# Patient Record
Sex: Male | Born: 1965 | Race: Black or African American | Hispanic: No | Marital: Married | Smoking: Former smoker
Health system: Southern US, Community
[De-identification: ages and names within clinical notes are randomized; demographics above are authoritative.]

## PROBLEM LIST (undated history)

## (undated) DIAGNOSIS — K219 Gastro-esophageal reflux disease without esophagitis: Secondary | ICD-10-CM

## (undated) DIAGNOSIS — M359 Systemic involvement of connective tissue, unspecified: Secondary | ICD-10-CM

## (undated) DIAGNOSIS — D649 Anemia, unspecified: Secondary | ICD-10-CM

## (undated) DIAGNOSIS — N189 Chronic kidney disease, unspecified: Secondary | ICD-10-CM

## (undated) DIAGNOSIS — K209 Esophagitis, unspecified without bleeding: Secondary | ICD-10-CM

## (undated) DIAGNOSIS — I272 Pulmonary hypertension, unspecified: Secondary | ICD-10-CM

## (undated) DIAGNOSIS — E119 Type 2 diabetes mellitus without complications: Secondary | ICD-10-CM

## (undated) DIAGNOSIS — I7300 Raynaud's syndrome without gangrene: Secondary | ICD-10-CM

## (undated) DIAGNOSIS — J8489 Other specified interstitial pulmonary diseases: Secondary | ICD-10-CM

## (undated) DIAGNOSIS — I10 Essential (primary) hypertension: Secondary | ICD-10-CM

## (undated) DIAGNOSIS — I499 Cardiac arrhythmia, unspecified: Secondary | ICD-10-CM

## (undated) DIAGNOSIS — K222 Esophageal obstruction: Secondary | ICD-10-CM

## (undated) DIAGNOSIS — M349 Systemic sclerosis, unspecified: Secondary | ICD-10-CM

## (undated) DIAGNOSIS — L943 Sclerodactyly: Secondary | ICD-10-CM

## (undated) DIAGNOSIS — J8410 Pulmonary fibrosis, unspecified: Secondary | ICD-10-CM

## (undated) DIAGNOSIS — E785 Hyperlipidemia, unspecified: Secondary | ICD-10-CM

## (undated) DIAGNOSIS — R1310 Dysphagia, unspecified: Secondary | ICD-10-CM

## (undated) HISTORY — PX: UPPER GI ENDOSCOPY: SHX6162

## (undated) HISTORY — PX: RENAL BIOPSY: SHX156

## (undated) HISTORY — PX: LUNG BIOPSY: SHX232

---

## 2013-04-29 ENCOUNTER — Ambulatory Visit: Payer: Self-pay | Admitting: Rheumatology

## 2013-07-16 ENCOUNTER — Other Ambulatory Visit: Payer: Self-pay | Admitting: Family Medicine

## 2013-07-16 LAB — CBC WITH DIFFERENTIAL/PLATELET
BASOS PCT: 0.5 %
Basophil #: 0 10*3/uL (ref 0.0–0.1)
EOS ABS: 0.3 10*3/uL (ref 0.0–0.7)
Eosinophil %: 4.1 %
HCT: 39.4 % — ABNORMAL LOW (ref 40.0–52.0)
HGB: 12.6 g/dL — ABNORMAL LOW (ref 13.0–18.0)
Lymphocyte #: 1.5 10*3/uL (ref 1.0–3.6)
Lymphocyte %: 20.4 %
MCH: 26.2 pg (ref 26.0–34.0)
MCHC: 31.9 g/dL — ABNORMAL LOW (ref 32.0–36.0)
MCV: 82 fL (ref 80–100)
MONOS PCT: 5.2 %
Monocyte #: 0.4 x10 3/mm (ref 0.2–1.0)
Neutrophil #: 5.3 10*3/uL (ref 1.4–6.5)
Neutrophil %: 69.8 %
Platelet: 185 10*3/uL (ref 150–440)
RBC: 4.8 10*6/uL (ref 4.40–5.90)
RDW: 15.4 % — ABNORMAL HIGH (ref 11.5–14.5)
WBC: 7.5 10*3/uL (ref 3.8–10.6)

## 2013-07-16 LAB — BASIC METABOLIC PANEL
Anion Gap: 4 — ABNORMAL LOW (ref 7–16)
BUN: 23 mg/dL — ABNORMAL HIGH (ref 7–18)
CALCIUM: 9 mg/dL (ref 8.5–10.1)
Chloride: 105 mmol/L (ref 98–107)
Co2: 28 mmol/L (ref 21–32)
Creatinine: 2.25 mg/dL — ABNORMAL HIGH (ref 0.60–1.30)
GFR CALC AF AMER: 39 — AB
GFR CALC NON AF AMER: 33 — AB
Glucose: 163 mg/dL — ABNORMAL HIGH (ref 65–99)
Osmolality: 281 (ref 275–301)
Potassium: 3.6 mmol/L (ref 3.5–5.1)
Sodium: 137 mmol/L (ref 136–145)

## 2013-07-16 LAB — HEPATIC FUNCTION PANEL A (ARMC)
ALK PHOS: 85 U/L
Albumin: 3.8 g/dL (ref 3.4–5.0)
BILIRUBIN TOTAL: 0.2 mg/dL (ref 0.2–1.0)
Bilirubin, Direct: <0.1 mg/dL (ref 0.00–0.20)
SGOT(AST): 14 U/L — ABNORMAL LOW (ref 15–37)
SGPT (ALT): 23 U/L (ref 12–78)
Total Protein: 8.2 g/dL (ref 6.4–8.2)

## 2013-07-16 LAB — HEMOGLOBIN A1C: Hemoglobin A1C: 7.2 % — ABNORMAL HIGH (ref 4.2–6.3)

## 2013-08-04 ENCOUNTER — Ambulatory Visit: Payer: Self-pay | Admitting: Nephrology

## 2014-02-15 ENCOUNTER — Emergency Department: Payer: Self-pay | Admitting: Emergency Medicine

## 2014-12-22 ENCOUNTER — Other Ambulatory Visit: Payer: Self-pay | Admitting: Specialist

## 2014-12-22 DIAGNOSIS — J8410 Pulmonary fibrosis, unspecified: Secondary | ICD-10-CM

## 2014-12-30 ENCOUNTER — Ambulatory Visit
Admission: RE | Admit: 2014-12-30 | Discharge: 2014-12-30 | Disposition: A | Discharge status: Home / Self Care | Payer: BLUE CROSS/BLUE SHIELD | Source: Ambulatory Visit | Attending: Specialist | Admitting: Specialist

## 2014-12-30 DIAGNOSIS — R590 Localized enlarged lymph nodes: Secondary | ICD-10-CM | POA: Insufficient documentation

## 2014-12-30 DIAGNOSIS — J439 Emphysema, unspecified: Secondary | ICD-10-CM | POA: Diagnosis not present

## 2014-12-30 DIAGNOSIS — R911 Solitary pulmonary nodule: Secondary | ICD-10-CM | POA: Diagnosis not present

## 2014-12-30 DIAGNOSIS — R0602 Shortness of breath: Secondary | ICD-10-CM | POA: Insufficient documentation

## 2014-12-30 DIAGNOSIS — J8410 Pulmonary fibrosis, unspecified: Secondary | ICD-10-CM

## 2015-02-03 ENCOUNTER — Other Ambulatory Visit: Payer: Self-pay | Admitting: Specialist

## 2015-02-03 DIAGNOSIS — R911 Solitary pulmonary nodule: Secondary | ICD-10-CM

## 2015-04-27 ENCOUNTER — Encounter: Payer: Self-pay | Admitting: *Deleted

## 2015-04-28 ENCOUNTER — Ambulatory Visit: Payer: BLUE CROSS/BLUE SHIELD | Admitting: Anesthesiology

## 2015-04-28 ENCOUNTER — Ambulatory Visit
Admission: RE | Admit: 2015-04-28 | Discharge: 2015-04-28 | Disposition: A | Discharge status: Home / Self Care | Payer: BLUE CROSS/BLUE SHIELD | Source: Ambulatory Visit | Attending: Gastroenterology | Admitting: Gastroenterology

## 2015-04-28 ENCOUNTER — Encounter
Admission: RE | Disposition: A | Discharge status: Home / Self Care | Payer: Self-pay | Source: Ambulatory Visit | Attending: Gastroenterology

## 2015-04-28 DIAGNOSIS — I10 Essential (primary) hypertension: Secondary | ICD-10-CM | POA: Diagnosis not present

## 2015-04-28 DIAGNOSIS — R1310 Dysphagia, unspecified: Secondary | ICD-10-CM | POA: Diagnosis not present

## 2015-04-28 DIAGNOSIS — K210 Gastro-esophageal reflux disease with esophagitis: Secondary | ICD-10-CM | POA: Insufficient documentation

## 2015-04-28 DIAGNOSIS — K222 Esophageal obstruction: Secondary | ICD-10-CM | POA: Insufficient documentation

## 2015-04-28 DIAGNOSIS — K449 Diaphragmatic hernia without obstruction or gangrene: Secondary | ICD-10-CM | POA: Diagnosis not present

## 2015-04-28 DIAGNOSIS — Z7982 Long term (current) use of aspirin: Secondary | ICD-10-CM | POA: Insufficient documentation

## 2015-04-28 DIAGNOSIS — E119 Type 2 diabetes mellitus without complications: Secondary | ICD-10-CM | POA: Diagnosis not present

## 2015-04-28 DIAGNOSIS — Z7984 Long term (current) use of oral hypoglycemic drugs: Secondary | ICD-10-CM | POA: Diagnosis not present

## 2015-04-28 DIAGNOSIS — I272 Other secondary pulmonary hypertension: Secondary | ICD-10-CM | POA: Insufficient documentation

## 2015-04-28 DIAGNOSIS — Z79899 Other long term (current) drug therapy: Secondary | ICD-10-CM | POA: Insufficient documentation

## 2015-04-28 DIAGNOSIS — I7300 Raynaud's syndrome without gangrene: Secondary | ICD-10-CM | POA: Diagnosis not present

## 2015-04-28 DIAGNOSIS — J8410 Pulmonary fibrosis, unspecified: Secondary | ICD-10-CM | POA: Insufficient documentation

## 2015-04-28 DIAGNOSIS — F17200 Nicotine dependence, unspecified, uncomplicated: Secondary | ICD-10-CM | POA: Diagnosis not present

## 2015-04-28 HISTORY — DX: Gastro-esophageal reflux disease without esophagitis: K21.9

## 2015-04-28 HISTORY — DX: Systemic sclerosis, unspecified: M34.9

## 2015-04-28 HISTORY — DX: Dysphagia, unspecified: R13.10

## 2015-04-28 HISTORY — PX: ESOPHAGOGASTRODUODENOSCOPY (EGD) WITH PROPOFOL: SHX5813

## 2015-04-28 HISTORY — DX: Sclerodactyly: L94.3

## 2015-04-28 HISTORY — DX: Pulmonary hypertension, unspecified: I27.20

## 2015-04-28 HISTORY — DX: Esophagitis, unspecified without bleeding: K20.90

## 2015-04-28 HISTORY — DX: Esophagitis, unspecified: K20.9

## 2015-04-28 HISTORY — DX: Cardiac arrhythmia, unspecified: I49.9

## 2015-04-28 HISTORY — DX: Type 2 diabetes mellitus without complications: E11.9

## 2015-04-28 HISTORY — DX: Raynaud's syndrome without gangrene: I73.00

## 2015-04-28 HISTORY — DX: Pulmonary fibrosis, unspecified: J84.10

## 2015-04-28 LAB — GLUCOSE, CAPILLARY: Glucose-Capillary: 86 mg/dL (ref 65–99)

## 2015-04-28 SURGERY — ESOPHAGOGASTRODUODENOSCOPY (EGD) WITH PROPOFOL
Anesthesia: General

## 2015-04-28 MED ORDER — PROPOFOL 500 MG/50ML IV EMUL
INTRAVENOUS | Status: DC | PRN
  Administered 2015-04-28: 180 ug/kg/min via INTRAVENOUS

## 2015-04-28 MED ORDER — PROPOFOL 10 MG/ML IV BOLUS
INTRAVENOUS | Status: DC | PRN
  Administered 2015-04-28: 100 mg via INTRAVENOUS
  Administered 2015-04-28: 20 mg via INTRAVENOUS

## 2015-04-28 MED ORDER — SODIUM CHLORIDE 0.9 % IV SOLN
INTRAVENOUS | Status: DC
  Administered 2015-04-28: 1000 mL via INTRAVENOUS
  Administered 2015-04-28: 08:00:00 via INTRAVENOUS

## 2015-04-28 NOTE — H&P (Signed)
Primary Care Physician:  Barbette ReichmannHANDE,VISHWANATH, MD  Pre-Procedure History & Physical: HPI:  Joe Mathews is a 50 y.o. male is here for an endoscopy.   Past Medical History  Diagnosis Date  . Diabetes mellitus without complication (HCC)   . Esophagitis   . Dysrhythmia   . Pulmonary fibrosis (HCC)   . Pulmonary hypertension (HCC)   . Scleroderma (HCC)   . Raynaud's disease   . Sclerodactyly   . Dysphagia   . GERD (gastroesophageal reflux disease)     Past Surgical History  Procedure Laterality Date  . Upper gi endoscopy    . Lung biopsy    . Renal biopsy      Prior to Admission medications   Medication Sig Start Date End Date Taking? Authorizing Provider  aspirin EC 81 MG tablet Take 81 mg by mouth daily.   Yes Historical Provider, MD  cloNIDine (CATAPRES - DOSED IN MG/24 HR) 0.2 mg/24hr patch Place 0.2 mg onto the skin once a week.   Yes Historical Provider, MD  doxazosin (CARDURA) 4 MG tablet Take 4 mg by mouth daily.   Yes Historical Provider, MD  furosemide (LASIX) 40 MG tablet Take 40 mg by mouth.   Yes Historical Provider, MD  glimepiride (AMARYL) 4 MG tablet Take 4 mg by mouth daily with breakfast.   Yes Historical Provider, MD  linagliptin (TRADJENTA) 5 MG TABS tablet Take 5 mg by mouth daily.   Yes Historical Provider, MD  Liraglutide (VICTOZA) 18 MG/3ML SOPN Inject 1.8 mg into the skin daily.   Yes Historical Provider, MD  Macitentan (OPSUMIT) 10 MG TABS Take 10 mg by mouth daily.   Yes Historical Provider, MD  metFORMIN (GLUCOPHAGE) 1000 MG tablet Take 1,000 mg by mouth 2 (two) times daily with a meal.   Yes Historical Provider, MD  mycophenolate (CELLCEPT) 500 MG tablet Take 1,500 mg by mouth 2 (two) times daily.   Yes Historical Provider, MD  NIFEdipine (ADALAT CC) 90 MG 24 hr tablet Take 90 mg by mouth daily.   Yes Historical Provider, MD  ranitidine (ZANTAC) 300 MG tablet Take 300 mg by mouth daily before breakfast.   Yes Historical Provider, MD  tadalafil  (CIALIS) 20 MG tablet Take 40 mg by mouth daily.   Yes Historical Provider, MD  valsartan (DIOVAN) 320 MG tablet Take 320 mg by mouth daily.   Yes Historical Provider, MD    Allergies as of 04/07/2015  . (Not on File)    Family History  Problem Relation Age of Onset  . Cancer Mother   . Diabetes Mellitus II Mother   . Diabetes Mellitus II Father     Social History   Social History  . Marital Status: Married    Spouse Name: N/A  . Number of Children: N/A  . Years of Education: N/A   Occupational History  . Not on file.   Social History Main Topics  . Smoking status: Current Every Day Smoker -- 0.25 packs/day    Types: Cigarettes  . Smokeless tobacco: Former NeurosurgeonUser    Quit date: 07/13/1985  . Alcohol Use: No  . Drug Use: No  . Sexual Activity: Not on file   Other Topics Concern  . Not on file   Social History Narrative     Physical Exam: BP 132/68 mmHg  Pulse 64  Temp(Src) 96.7 F (35.9 C) (Tympanic)  Resp 17  Ht 6\' 2"  (1.88 m)  Wt 121.564 kg (268 lb)  BMI 34.39 kg/m2  SpO2 100% General:   Alert,  pleasant and cooperative in NAD Head:  Normocephalic and atraumatic. Neck:  Supple; no masses or thyromegaly. Lungs:  Clear throughout to auscultation.    Heart:  Regular rate and rhythm. Abdomen:  Soft, nontender and nondistended. Normal bowel sounds, without guarding, and without rebound.   Neurologic:  Alert and  oriented x4;  grossly normal neurologically.  Impression/Plan: Joe Mathews is here for an endoscopy to be performed for dysphagia, hx schatzki ring, renal intolerance with PPI  Risks, benefits, limitations, and alternatives regarding  endoscopy have been reviewed with the patient.  Questions have been answered.  All parties agreeable.   Elnita Maxwell, MD  04/28/2015, 8:26 AM

## 2015-04-28 NOTE — Transfer of Care (Signed)
Immediate Anesthesia Transfer of Care Note  Patient: Joe Mathews  Procedure(s) Performed: Procedure(s): ESOPHAGOGASTRODUODENOSCOPY (EGD) WITH PROPOFOL (N/A)  Patient Location: PACU  Anesthesia Type:General  Level of Consciousness: sedated  Airway & Oxygen Therapy: Patient Spontanous Breathing and Patient connected to nasal cannula oxygen  Post-op Assessment: Report given to RN and Post -op Vital signs reviewed and stable  Post vital signs: Reviewed and stable  Last Vitals:  Filed Vitals:   04/28/15 0810  BP: 132/68  Pulse: 64  Temp: 35.9 C  Resp: 17    Complications: No apparent anesthesia complications

## 2015-04-28 NOTE — Op Note (Signed)
[facility] Gastroenterology Patient Name: Joe Mathews Procedure Date: 04/28/2015 8:21 AM MRN: [id number] Account #: [id number] Date of Birth: 04-10-1965 Admit Type: Outpatient Age: 50 Room: [facility] ENDO ROOM 1 Gender: Male Note Status: Finalized Procedure:            Upper GI endoscopy Indications:          Dysphagia, , Hx of Schatzki Ring, Renal intolerance to                        PPI Patient Profile:      This is a 50 year old male. Providers:            Rhona Raider. Shelle Iron, MD Referring MD:         Barbette Reichmann, MD (Referring MD) Medicines:            Propofol per Anesthesia Complications:        No immediate complications. Procedure:            Pre-Anesthesia Assessment:                       - Prior to the procedure, a History and Physical was                        performed, and patient medications, allergies and                        sensitivities were reviewed. The patient's tolerance of                        previous anesthesia was reviewed.                       After obtaining informed consent, the endoscope was                        passed under direct vision. Throughout the procedure,                        the patient's blood pressure, pulse, and oxygen                        saturations were monitored continuously. The Endoscope                        was introduced through the mouth, and advanced to the                        second part of duodenum. The upper GI endoscopy was                        accomplished without difficulty. The patient tolerated                        the procedure well. Findings:      A small hiatal hernia was present.      LA Grade C (one or more mucosal breaks continuous between tops of 2 or       more mucosal folds, less than 75% circumference) esophagitis with no       bleeding was found at the gastroesophageal junction. Biopsies were taken  with a cold forceps for histology.      A moderate Schatzki  ring (acquired) was found at the gastroesophageal       junction. A TTS dilator was passed through the scope. Dilation with a       15-16.5-18 mm x 5.5 cm CRE balloon dilator was performed to 15 mm. Not       dilated further due to bleeding.      The stomach was normal.      The examined duodenum was normal. Impression:           - Small hiatal hernia.                       - LA Grade C reflux esophagitis. Biopsied.                       - Moderate Schatzki ring. Dilated to 15 mm. Not dilated                        further due to bleeding.                       - Normal stomach.                       - Normal examined duodenum. Recommendation:       - Observe patient in GI recovery unit.                       - Resume regular diet.                       - Continue present medications.                       - Increase zantac to 150 mg BID                       - Await pathology results.                       - Repeat upper endoscopy in 3 weeks for retreatment.                       - May require Nissen fundoplication since previous                        kidney injury on PPI and is class effect.                       - The findings and recommendations were discussed with                        the patient.                       - The findings and recommendations were discussed with                        the patient's family. Procedure Code(s):    --- Professional ---                       [phone], Esophagogastroduodenoscopy, flexible,  transoral;                        with transendoscopic balloon dilation of esophagus                        (less than 30 mm diameter)                       43239, Esophagogastroduodenoscopy, flexible, transoral;                        with biopsy, single or multiple Diagnosis Code(s):    --- Professional ---                       K44.9, Diaphragmatic hernia without obstruction or                        gangrene                       K21.0,  Gastro-esophageal reflux disease with esophagitis                       K22.2, Esophageal obstruction                       R13.10, Dysphagia, unspecified CPT copyright 2016 American Medical Association. All rights reserved. The codes documented in this report are preliminary and upon coder review may  be revised to meet current compliance requirements. Kathalene Frames, MD 04/28/2015 8:52:08 AM This report has been signed electronically. Number of Addenda: 0 Note Initiated On: 04/28/2015 8:21 AM Estimated Blood Loss: Estimated blood loss was minimal.      [facility]

## 2015-04-28 NOTE — Anesthesia Postprocedure Evaluation (Signed)
Anesthesia Post Note  Patient: Joe Mathews  Procedure(s) Performed: Procedure(s) (LRB): ESOPHAGOGASTRODUODENOSCOPY (EGD) WITH PROPOFOL (N/A)  Patient location during evaluation: PACU Anesthesia Type: General Level of consciousness: awake and alert and oriented Pain management: pain level controlled Vital Signs Assessment: post-procedure vital signs reviewed and stable Respiratory status: spontaneous breathing Cardiovascular status: blood pressure returned to baseline Anesthetic complications: no    Last Vitals:  Filed Vitals:   04/28/15 0913 04/28/15 0923  BP: 121/74 125/81  Pulse: 59 60  Temp:    Resp: 13 15    Last Pain:  Filed Vitals:   04/28/15 0924  PainSc: Asleep                 [name]

## 2015-04-28 NOTE — Anesthesia Preprocedure Evaluation (Signed)
Anesthesia Evaluation  Patient identified by MRN, date of birth, ID band Patient awake    Reviewed: Allergy & Precautions, NPO status , Patient's Chart, lab work & pertinent test results  Airway Mallampati: II  TM Distance: >3 FB     Dental no notable dental hx.    Pulmonary Current Smoker,  Pulmonary fibrosis. HTN   Pulmonary exam normal        Cardiovascular + Peripheral Vascular Disease  Normal cardiovascular exam+ dysrhythmias      Neuro/Psych negative neurological ROS  negative psych ROS   GI/Hepatic Neg liver ROS, GERD  Controlled and Medicated,  Endo/Other  diabetes, Well Controlled, Type 2, Oral Hypoglycemic Agents  Renal/GU negative Renal ROS     Musculoskeletal   Abdominal Normal abdominal exam  (+)   Peds  Hematology negative hematology ROS (+)   Anesthesia Other Findings Scleroderma raynauds Pulmonary fibrosis and HTN  Reproductive/Obstetrics                             Anesthesia Physical Anesthesia Plan  ASA: III  Anesthesia Plan: General   Post-op Pain Management:    Induction: Intravenous  Airway Management Planned: Nasal Cannula  Additional Equipment:   Intra-op Plan:   Post-operative Plan:   Informed Consent: I have reviewed the patients History and Physical, chart, labs and discussed the procedure including the risks, benefits and alternatives for the proposed anesthesia with the patient or authorized representative who has indicated his/her understanding and acceptance.   Dental advisory given  Plan Discussed with: CRNA and Surgeon  Anesthesia Plan Comments:         Anesthesia Quick Evaluation

## 2015-04-28 NOTE — Anesthesia Procedure Notes (Signed)
Date/Time: 04/28/2015 8:40 AM Performed by: Junious SilkNOLES, Dacotah Cabello Pre-anesthesia Checklist: Patient identified, Emergency Drugs available, Suction available, Patient being monitored and Timeout performed Oxygen Delivery Method: Nasal cannula

## 2015-05-01 LAB — SURGICAL PATHOLOGY

## 2015-05-03 ENCOUNTER — Encounter: Payer: Self-pay | Admitting: Gastroenterology

## 2015-06-17 ENCOUNTER — Emergency Department
Admission: EM | Admit: 2015-06-17 | Discharge: 2015-06-17 | Disposition: A | Discharge status: Home / Self Care | Payer: Medicare Other | Attending: Emergency Medicine | Admitting: Emergency Medicine

## 2015-06-17 ENCOUNTER — Encounter: Payer: Self-pay | Admitting: *Deleted

## 2015-06-17 DIAGNOSIS — E119 Type 2 diabetes mellitus without complications: Secondary | ICD-10-CM | POA: Diagnosis not present

## 2015-06-17 DIAGNOSIS — Z7984 Long term (current) use of oral hypoglycemic drugs: Secondary | ICD-10-CM | POA: Insufficient documentation

## 2015-06-17 DIAGNOSIS — K222 Esophageal obstruction: Secondary | ICD-10-CM | POA: Insufficient documentation

## 2015-06-17 DIAGNOSIS — Z7982 Long term (current) use of aspirin: Secondary | ICD-10-CM | POA: Diagnosis not present

## 2015-06-17 DIAGNOSIS — F17210 Nicotine dependence, cigarettes, uncomplicated: Secondary | ICD-10-CM | POA: Insufficient documentation

## 2015-06-17 DIAGNOSIS — R0789 Other chest pain: Secondary | ICD-10-CM | POA: Diagnosis present

## 2015-06-17 DIAGNOSIS — Z79899 Other long term (current) drug therapy: Secondary | ICD-10-CM | POA: Insufficient documentation

## 2015-06-17 DIAGNOSIS — Z8679 Personal history of other diseases of the circulatory system: Secondary | ICD-10-CM | POA: Insufficient documentation

## 2015-06-17 LAB — BASIC METABOLIC PANEL
Anion gap: 5 (ref 5–15)
BUN: 21 mg/dL — ABNORMAL HIGH (ref 6–20)
CALCIUM: 8.6 mg/dL — AB (ref 8.9–10.3)
CO2: 27 mmol/L (ref 22–32)
CREATININE: 1.63 mg/dL — AB (ref 0.61–1.24)
Chloride: 109 mmol/L (ref 101–111)
GFR calc non Af Amer: 48 mL/min — ABNORMAL LOW (ref >60)
GFR, EST AFRICAN AMERICAN: 55 mL/min — AB (ref >60)
Glucose, Bld: 77 mg/dL (ref 65–99)
Potassium: 4.5 mmol/L (ref 3.5–5.1)
SODIUM: 141 mmol/L (ref 135–145)

## 2015-06-17 LAB — CBC
HCT: 36.3 % — ABNORMAL LOW (ref 40.0–52.0)
Hemoglobin: 11.4 g/dL — ABNORMAL LOW (ref 13.0–18.0)
MCH: 26.1 pg (ref 26.0–34.0)
MCHC: 31.4 g/dL — ABNORMAL LOW (ref 32.0–36.0)
MCV: 83.1 fL (ref 80.0–100.0)
PLATELETS: 250 10*3/uL (ref 150–440)
RBC: 4.37 MIL/uL — AB (ref 4.40–5.90)
RDW: 15.7 % — ABNORMAL HIGH (ref 11.5–14.5)
WBC: 8.6 10*3/uL (ref 3.8–10.6)

## 2015-06-17 LAB — TROPONIN I

## 2015-06-17 NOTE — ED Provider Notes (Signed)
[facility] Emergency Department Provider Note    ____________________________________________  Time seen: ~0200  I have reviewed the triage vital signs and the nursing notes.   HISTORY  Chief Complaint Chest Pain   History limited by: Not Limited   HPI Joe Mathews is a 50 y.o. male who presents to the emergency department today because of concerns for pitting up and chest pressure. The patient has a history of esophageal strictures and has undergone dilations in the past. He states that occasionally has episodes where he will be unable to completely swallow foods or liquids and will spit them out. This got bad today at dinnertime. At that time they also checked his blood pressure and found it to be elevated with systolics in the 180s. At around this time he also started developing chest pressure. It was located in the center of his chest. It does not radiate. It was not associated with any shortness of breath. He states it felt like reflux.    Past Medical History  Diagnosis Date  . Diabetes mellitus without complication (HCC)   . Esophagitis   . Dysrhythmia   . Pulmonary fibrosis (HCC)   . Pulmonary hypertension (HCC)   . Scleroderma (HCC)   . Raynaud's disease   . Sclerodactyly   . Dysphagia   . GERD (gastroesophageal reflux disease)     There are no active problems to display for this patient.   Past Surgical History  Procedure Laterality Date  . Upper gi endoscopy    . Lung biopsy    . Renal biopsy    . Esophagogastroduodenoscopy (egd) with propofol N/A 04/28/2015    Procedure: ESOPHAGOGASTRODUODENOSCOPY (EGD) WITH PROPOFOL;  Surgeon: Elnita Maxwell, MD;  Location: [facility] ENDOSCOPY;  Service: Endoscopy;  Laterality: N/A;    Current Outpatient Rx  Name  Route  Sig  Dispense  Refill  . aspirin EC 81 MG tablet   Oral   Take 81 mg by mouth daily.         . cloNIDine (CATAPRES - DOSED IN MG/24 HR) 0.2 mg/24hr patch    Transdermal   Place 0.2 mg onto the skin once a week.         . doxazosin (CARDURA) 4 MG tablet   Oral   Take 4 mg by mouth daily.         . furosemide (LASIX) 40 MG tablet   Oral   Take 40 mg by mouth.         Marland Kitchen glimepiride (AMARYL) 4 MG tablet   Oral   Take 4 mg by mouth daily with breakfast.         . linagliptin (TRADJENTA) 5 MG TABS tablet   Oral   Take 5 mg by mouth daily.         . Liraglutide (VICTOZA) 18 MG/3ML SOPN   Subcutaneous   Inject 1.8 mg into the skin daily.         . Macitentan (OPSUMIT) 10 MG TABS   Oral   Take 10 mg by mouth daily.         . metFORMIN (GLUCOPHAGE) 1000 MG tablet   Oral   Take 1,000 mg by mouth 2 (two) times daily with a meal.         . mycophenolate (CELLCEPT) 500 MG tablet   Oral   Take 1,500 mg by mouth 2 (two) times daily.         Marland Kitchen NIFEdipine (ADALAT CC) 90 MG  24 hr tablet   Oral   Take 90 mg by mouth daily.         . ranitidine (ZANTAC) 300 MG tablet   Oral   Take 300 mg by mouth daily before breakfast.         . tadalafil (CIALIS) 20 MG tablet   Oral   Take 40 mg by mouth daily.         . valsartan (DIOVAN) 320 MG tablet   Oral   Take 320 mg by mouth daily.           Allergies Protonix  Family History  Problem Relation Age of Onset  . Cancer Mother   . Diabetes Mellitus II Mother   . Diabetes Mellitus II Father     Social History Social History  Substance Use Topics  . Smoking status: Current Every Day Smoker -- 0.25 packs/day    Types: Cigarettes  . Smokeless tobacco: Former Neurosurgeon    Quit date: 07/13/1985  . Alcohol Use: No    Review of Systems  Constitutional: Negative for fever. Cardiovascular: Positive for chest pressure Respiratory: Negative for shortness of breath. Gastrointestinal: Negative for abdominal pain, vomiting and diarrhea. Neurological: Negative for headaches, focal weakness or numbness.  10-point ROS otherwise  negative.  ____________________________________________   PHYSICAL EXAM:  VITAL SIGNS: ED Triage Vitals  Enc Vitals Group     BP 06/17/15 0047 182/105 mmHg     Pulse Rate 06/17/15 0047 70     Resp 06/17/15 0047 22     Temp 06/17/15 0047 98 F (36.7 C)     Temp Source 06/17/15 0047 Oral     SpO2 06/17/15 0047 98 %     Weight 06/17/15 0047 265 lb (120.203 kg)     Height 06/17/15 0047 6\' 2"  (1.88 m)     Head Cir --      Peak Flow --      Pain Score 06/17/15 0110 0   Constitutional: Alert and oriented. Well appearing and in no distress. Eyes: Conjunctivae are normal. PERRL. Normal extraocular movements. ENT   Head: Normocephalic and atraumatic.   Nose: No congestion/rhinnorhea.   Mouth/Throat: Mucous membranes are moist.   Neck: No stridor. Hematological/Lymphatic/Immunilogical: No cervical lymphadenopathy. Cardiovascular: Normal rate, regular rhythm.  No murmurs, rubs, or gallops. Respiratory: Normal respiratory effort without tachypnea nor retractions. Breath sounds are clear and equal bilaterally. No wheezes/rales/rhonchi. Gastrointestinal: Soft and nontender. No distention. There is no CVA tenderness. Genitourinary: Deferred Musculoskeletal: Normal range of motion in all extremities. No joint effusions.  No lower extremity tenderness nor edema. Neurologic:  Normal speech and language. No gross focal neurologic deficits are appreciated.  Skin:  Skin is warm, dry and intact. No rash noted. Psychiatric: Mood and affect are normal. Speech and behavior are normal. Patient exhibits appropriate insight and judgment.  ____________________________________________    LABS (pertinent positives/negatives)  Labs Reviewed  BASIC METABOLIC PANEL - Abnormal; Notable for the following:    BUN 21 (*)    Creatinine, Ser 1.63 (*)    Calcium 8.6 (*)    GFR calc non Af Amer 48 (*)    GFR calc Af Amer 55 (*)    All other components within normal limits  CBC - Abnormal;  Notable for the following:    RBC 4.37 (*)    Hemoglobin 11.4 (*)    HCT 36.3 (*)    MCHC 31.4 (*)    RDW 15.7 (*)    All other components within  normal limits  TROPONIN I     ____________________________________________   EKG  I, Phineas SemenGraydon Ervin Rothbauer, attending physician, personally viewed and interpreted this EKG  EKG Time: 0058 Rate: 79 Rhythm: normal sinus rhythm with PVC Axis: left axis deviation Intervals: qtc 451 QRS: narrow ST changes: no st elevation Impression: abnormal ekg   ____________________________________________    RADIOLOGY  None  ____________________________________________   PROCEDURES  Procedure(s) performed: None  Critical Care performed: No  ____________________________________________   INITIAL IMPRESSION / ASSESSMENT AND PLAN / ED COURSE  Pertinent labs & imaging results that were available during my care of the patient were reviewed by me and considered in my medical decision making (see chart for details).  Patient presented to the emergency department today because of concerns for some chest pressure and difficulty swallowing. Patient does have a history of esophageal strictures. EKG and troponin without concerning findings for acute ischemia. I think likely the patient's symptoms are related to esophageal stricture. Patient does have a GI doctor. Encouraged patient to follow up. Patient continued to spit up in the emergency department and I did offer to watch him longer however he chose to go home. He states he does typically feel better in the mornings.   ____________________________________________   FINAL CLINICAL IMPRESSION(S) / ED DIAGNOSES  Final diagnoses:  Esophageal stricture     Note: This dictation was prepared with Dragon dictation. Any transcriptional errors that result from this process are unintentional    Phineas SemenGraydon Dakia Schifano, MD 06/17/15 724 [phone]

## 2015-06-17 NOTE — ED Notes (Signed)
Pt presents w/ list of complaints. Pt states he started w/ n/v at 1700 last night. Pt has hx of esophageal strictures, pulmonary HTN, wears home O2, hx of acid reflux. Pt c/o chest pressure beginning at 2200. Pt states his wife made him come to ED tonight. Pt in no acute distress at this time. Pt is presently having difficulty managing secretions, c/o spitting up phlegm.

## 2015-06-17 NOTE — Discharge Instructions (Signed)
Please seek medical attention for any high fevers, chest pain, shortness of breath, change in behavior, persistent vomiting, bloody stool or any other new or concerning symptoms.   Esophageal Stricture Esophageal stricture is a condition that causes the esophagus to become narrow. The esophagus is the long tube in your throat that carries food and liquid from your mouth to your stomach. Esophageal stricture can make it difficult, painful, or even impossible to swallow. The condition also makes choking more likely.  CAUSES  Gastroesophageal reflux disease (GERD) is the most common cause of esophageal stricture. In GERD, stomach acid backs up into the esophagus. Over time, this causes scar tissue and leads to narrowing (stricture). Other causes of esophageal stricture include:  Scarring from ingesting a harmful substance.  Damage from medical instruments used in the esophagus.  Radiation therapy.  Cancer. RISK FACTORS You are at greater risk for esophageal stricture if you have GERD or esophageal cancer. SIGNS AND SYMPTOMS  Signs and symptoms of esophageal stricture include:  Difficulty swallowing.  Pain when swallowing.  Heartburn.  Vomiting or spitting up (regurgitating) food or liquids.  Weight loss.  DIAGNOSIS  Your health care provider may suspect esophageal stricture based on your symptoms. A physical exam will also be done. You may need tests to confirm the diagnosis. These can include:  Upper endoscopy. Your health care provider will insert a flexible tube with a tiny camera on it (endoscope) into your esophagus to check for a stricture. A tissue sample may also be taken to be examined under a microscope (biopsy).  Esophageal pH monitoring. This test involves collecting acid in the esophagus with a tube to determine how much stomach acid is entering the esophagus.  Barium swallow test. For this test, you will drink a barium solution that coats the lining of the esophagus.  Then you will have an X-ray taken. The barium solution helps to show if there is stricture. TREATMENT Treatment for esophageal stricture depends on what is causing your condition and how severe it is. Treatment options include:  Esophageal dilatation. In this procedure, a health care provider inserts an endoscope or a tool called a dilator into your esophagus to gently stretch it and make the opening wider.  Stents. In some cases, your health care provider may place a small device (stent) in the esophagus to keep it open.  Acid-blocking medicines. Taking these helps manage GERD symptoms after an esophageal stricture. This can prevent the stricture from returning. HOME CARE INSTRUCTIONS  Do not drink alcohol.  Do not use any tobacco products, including cigarettes, chewing tobacco, or electronic cigarettes. If you need help quitting, ask your health care provider.  Lose weight if you are overweight.  Wear loose, comfortable clothing.  Do not eat for 3 hours before bedtime.  Elevate your head in bed with pillows.  Do not overeat at meals.  Do not eat foods that make reflux worse. These include:  Fatty foods.  Spicy foods.  Soda.  Tomato products.  Chocolate. SEEK MEDICAL CARE IF:  You have problems eating or swallowing.  You regurgitate food and liquid. MAKE SURE YOU:  Understand these instructions.  Will watch your condition.  Will get help right away if you are not doing well or get worse.   This information is not intended to replace advice given to you by your health care provider. Make sure you discuss any questions you have with your health care provider.   Document Released: 09/03/2005 Document Revised: 01/14/2014 Document Reviewed: 05/05/2013  Elsevier Interactive Patient Education ©2016 [employer] ° °

## 2015-07-13 ENCOUNTER — Encounter: Payer: Self-pay | Admitting: *Deleted

## 2015-07-14 ENCOUNTER — Ambulatory Visit: Payer: Medicare Other | Admitting: Certified Registered Nurse Anesthetist

## 2015-07-14 ENCOUNTER — Encounter
Admission: RE | Disposition: A | Discharge status: Home / Self Care | Payer: Self-pay | Source: Ambulatory Visit | Attending: Unknown Physician Specialty

## 2015-07-14 ENCOUNTER — Encounter: Payer: Self-pay | Admitting: Anesthesiology

## 2015-07-14 ENCOUNTER — Ambulatory Visit
Admission: RE | Admit: 2015-07-14 | Discharge: 2015-07-14 | Disposition: A | Discharge status: Home / Self Care | Payer: Medicare Other | Source: Ambulatory Visit | Attending: Unknown Physician Specialty | Admitting: Unknown Physician Specialty

## 2015-07-14 DIAGNOSIS — Z833 Family history of diabetes mellitus: Secondary | ICD-10-CM | POA: Diagnosis not present

## 2015-07-14 DIAGNOSIS — Z888 Allergy status to other drugs, medicaments and biological substances status: Secondary | ICD-10-CM | POA: Insufficient documentation

## 2015-07-14 DIAGNOSIS — Z7982 Long term (current) use of aspirin: Secondary | ICD-10-CM | POA: Diagnosis not present

## 2015-07-14 DIAGNOSIS — I272 Other secondary pulmonary hypertension: Secondary | ICD-10-CM | POA: Insufficient documentation

## 2015-07-14 DIAGNOSIS — M349 Systemic sclerosis, unspecified: Secondary | ICD-10-CM | POA: Diagnosis not present

## 2015-07-14 DIAGNOSIS — Z9889 Other specified postprocedural states: Secondary | ICD-10-CM | POA: Insufficient documentation

## 2015-07-14 DIAGNOSIS — J8410 Pulmonary fibrosis, unspecified: Secondary | ICD-10-CM | POA: Insufficient documentation

## 2015-07-14 DIAGNOSIS — Z7984 Long term (current) use of oral hypoglycemic drugs: Secondary | ICD-10-CM | POA: Insufficient documentation

## 2015-07-14 DIAGNOSIS — F17210 Nicotine dependence, cigarettes, uncomplicated: Secondary | ICD-10-CM | POA: Diagnosis not present

## 2015-07-14 DIAGNOSIS — K210 Gastro-esophageal reflux disease with esophagitis: Secondary | ICD-10-CM | POA: Insufficient documentation

## 2015-07-14 DIAGNOSIS — E119 Type 2 diabetes mellitus without complications: Secondary | ICD-10-CM | POA: Diagnosis not present

## 2015-07-14 DIAGNOSIS — I7300 Raynaud's syndrome without gangrene: Secondary | ICD-10-CM | POA: Diagnosis not present

## 2015-07-14 DIAGNOSIS — K222 Esophageal obstruction: Secondary | ICD-10-CM | POA: Insufficient documentation

## 2015-07-14 DIAGNOSIS — K640 First degree hemorrhoids: Secondary | ICD-10-CM | POA: Diagnosis not present

## 2015-07-14 DIAGNOSIS — Z1211 Encounter for screening for malignant neoplasm of colon: Secondary | ICD-10-CM | POA: Diagnosis present

## 2015-07-14 DIAGNOSIS — K6289 Other specified diseases of anus and rectum: Secondary | ICD-10-CM | POA: Diagnosis not present

## 2015-07-14 DIAGNOSIS — Z79899 Other long term (current) drug therapy: Secondary | ICD-10-CM | POA: Insufficient documentation

## 2015-07-14 DIAGNOSIS — R1310 Dysphagia, unspecified: Secondary | ICD-10-CM | POA: Insufficient documentation

## 2015-07-14 HISTORY — PX: ESOPHAGOGASTRODUODENOSCOPY (EGD) WITH PROPOFOL: SHX5813

## 2015-07-14 HISTORY — PX: COLONOSCOPY WITH PROPOFOL: SHX5780

## 2015-07-14 LAB — GLUCOSE, CAPILLARY: GLUCOSE-CAPILLARY: 109 mg/dL — AB (ref 65–99)

## 2015-07-14 SURGERY — COLONOSCOPY WITH PROPOFOL
Anesthesia: General

## 2015-07-14 MED ORDER — LABETALOL HCL 5 MG/ML IV SOLN
5.0000 mg | Freq: Once | INTRAVENOUS | Status: AC
  Administered 2015-07-14: 5 mg via INTRAVENOUS

## 2015-07-14 MED ORDER — GLYCOPYRROLATE 0.2 MG/ML IJ SOLN
INTRAMUSCULAR | Status: DC | PRN
  Administered 2015-07-14: 0.2 mg via INTRAVENOUS

## 2015-07-14 MED ORDER — PROPOFOL 10 MG/ML IV BOLUS
INTRAVENOUS | Status: DC | PRN
  Administered 2015-07-14 (×3): 20 mg via INTRAVENOUS

## 2015-07-14 MED ORDER — FENTANYL CITRATE (PF) 100 MCG/2ML IJ SOLN: 25.0000 ug | INTRAMUSCULAR | Status: DC | PRN

## 2015-07-14 MED ORDER — LIDOCAINE HCL (CARDIAC) 20 MG/ML IV SOLN
INTRAVENOUS | Status: DC | PRN
  Administered 2015-07-14: 20 mg via INTRAVENOUS

## 2015-07-14 MED ORDER — FENTANYL CITRATE (PF) 100 MCG/2ML IJ SOLN
INTRAMUSCULAR | Status: DC | PRN
  Administered 2015-07-14: 50 ug via INTRAVENOUS

## 2015-07-14 MED ORDER — SODIUM CHLORIDE 0.9 % IV SOLN: INTRAVENOUS | Status: DC

## 2015-07-14 MED ORDER — SODIUM CHLORIDE 0.9 % IV SOLN
INTRAVENOUS | Status: DC
Start: 2015-07-14 — End: 2015-07-14
  Administered 2015-07-14: 1000 mL via INTRAVENOUS

## 2015-07-14 MED ORDER — PROPOFOL 500 MG/50ML IV EMUL
INTRAVENOUS | Status: DC | PRN
  Administered 2015-07-14: 120 ug/kg/min via INTRAVENOUS

## 2015-07-14 MED ORDER — LABETALOL HCL 5 MG/ML IV SOLN
5.0000 mg | INTRAVENOUS | Status: DC | PRN
  Administered 2015-07-14: 5 mg via INTRAVENOUS
  Filled 2015-07-14: qty 4

## 2015-07-14 MED ORDER — MIDAZOLAM HCL 2 MG/2ML IJ SOLN
INTRAMUSCULAR | Status: DC | PRN
  Administered 2015-07-14: 1 mg via INTRAVENOUS

## 2015-07-14 MED ORDER — ONDANSETRON HCL 4 MG/2ML IJ SOLN: 4.0000 mg | Freq: Once | INTRAMUSCULAR | Status: DC | PRN

## 2015-07-14 NOTE — H&P (Signed)
Primary Care Physician:  Barbette ReichmannHANDE,VISHWANATH, MD Primary Gastroenterologist:  Dr. Mechele CollinElliott  Pre-Procedure History & Physical: HPI:  Joe NeedyClarence M Mathews is a 50 y.o. male is here for an endoscopy and colonoscopy.   Past Medical History  Diagnosis Date  . Diabetes mellitus without complication (HCC)   . Esophagitis   . Dysrhythmia   . Pulmonary fibrosis (HCC)   . Pulmonary hypertension (HCC)   . Scleroderma (HCC)   . Raynaud's disease   . Sclerodactyly   . Dysphagia   . GERD (gastroesophageal reflux disease)     Past Surgical History  Procedure Laterality Date  . Upper gi endoscopy    . Lung biopsy    . Renal biopsy    . Esophagogastroduodenoscopy (egd) with propofol N/A 04/28/2015    Procedure: ESOPHAGOGASTRODUODENOSCOPY (EGD) WITH PROPOFOL;  Surgeon: Elnita MaxwellMatthew Gordon Rein, MD;  [facility];  Service: Endoscopy;  Laterality: N/A;    Prior to Admission medications   Medication Sig Start Date End Date Taking? Authorizing Provider  ADCIRCA 20 MG TABS Take 1 tablet by mouth 2 (two) times daily. 06/12/15  Yes Historical Provider, MD  aspirin EC 81 MG tablet Take 81 mg by mouth daily.   Yes Historical Provider, MD  cloNIDine (CATAPRES - DOSED IN MG/24 HR) 0.2 mg/24hr patch Place 0.2 mg onto the skin once a week.   Yes Historical Provider, MD  doxazosin (CARDURA) 4 MG tablet Take 4 mg by mouth daily.   Yes Historical Provider, MD  furosemide (LASIX) 40 MG tablet Take 40 mg by mouth.   Yes Historical Provider, MD  glimepiride (AMARYL) 4 MG tablet Take 4 mg by mouth daily with breakfast.   Yes Historical Provider, MD  linagliptin (TRADJENTA) 5 MG TABS tablet Take 5 mg by mouth daily.   Yes Historical Provider, MD  Liraglutide (VICTOZA) 18 MG/3ML SOPN Inject 1.8 mg into the skin daily.   Yes Historical Provider, MD  Macitentan (OPSUMIT) 10 MG TABS Take 10 mg by mouth daily.   Yes Historical Provider, MD  metFORMIN (GLUCOPHAGE) 1000 MG tablet Take 1,000 mg by mouth 2 (two) times  daily with a meal.   Yes Historical Provider, MD  mycophenolate (CELLCEPT) 500 MG tablet Take 1,500 mg by mouth 2 (two) times daily.   Yes Historical Provider, MD  NIFEdipine (ADALAT CC) 90 MG 24 hr tablet Take 90 mg by mouth daily.   Yes Historical Provider, MD  ranitidine (ZANTAC) 300 MG tablet Take 300 mg by mouth daily before breakfast.   Yes Historical Provider, MD  tadalafil (CIALIS) 20 MG tablet Take 40 mg by mouth daily.   Yes Historical Provider, MD  valsartan (DIOVAN) 320 MG tablet Take 320 mg by mouth daily.   Yes Historical Provider, MD    Allergies as of 06/20/2015 - Review Complete 06/17/2015  Allergen Reaction Noted  . Protonix [pantoprazole sodium]  04/27/2015    Family History  Problem Relation Age of Onset  . Cancer Mother   . Diabetes Mellitus II Mother   . Diabetes Mellitus II Father     Social History   Social History  . Marital Status: Married    Spouse Name: N/A  . Number of Children: N/A  . Years of Education: N/A   Occupational History  . Not on file.   Social History Main Topics  . Smoking status: Current Every Day Smoker -- 0.25 packs/day    Types: Cigarettes  . Smokeless tobacco: Former NeurosurgeonUser    Quit date: 07/13/1985  .  Alcohol Use: No  . Drug Use: No  . Sexual Activity: Not on file   Other Topics Concern  . Not on file   Social History Narrative    Review of Systems: See HPI, otherwise negative ROS  Physical Exam: BP 170/109 mmHg  Pulse 73  Temp(Src) 98.4 F (36.9 C) (Tympanic)  Resp 16  Ht 6\' 3"  (1.905 m)  Wt 122.471 kg (270 lb)  BMI 33.75 kg/m2  SpO2 98% General:   Alert,  pleasant and cooperative in NAD Head:  Normocephalic and atraumatic. Neck:  Supple; no masses or thyromegaly. Lungs:  Clear throughout to auscultation.    Heart:  Regular rate and rhythm. Abdomen:  Soft, nontender and nondistended. Normal bowel sounds, without guarding, and without rebound.   Neurologic:  Alert and  oriented x4;  grossly normal  neurologically.  Impression/Plan: Joe Mathews is here for an endoscopy and colonoscopy to be performed for dysphagia, GERD, Screening colonoscopy  Risks, benefits, limitations, and alternatives regarding  endoscopy and colonoscopy have been reviewed with the patient.  Questions have been answered.  All parties agreeable.   Lynnae PrudeELLIOTT, ROBERT, MD  07/14/2015, 8:10 AM

## 2015-07-14 NOTE — Op Note (Signed)
[facility] Gastroenterology Patient Name: Joe AlbertaClarence Mathews Procedure Date: 07/14/2015 8:17 AM MRN: [id number] Account #: [id number] Date of Birth: 06/09/1965 Admit Type: Outpatient Age: [age] [facility] Gender: Male Note Status: Finalized Procedure:            Upper GI endoscopy Indications:          Dysphagia Providers:            Scot Junobert T. Elliott, MD Referring MD:         Barbette ReichmannVishwanath Hande, MD (Referring MD) Medicines:            Propofol per Anesthesia Complications:        No immediate complications. Procedure:            Pre-Anesthesia Assessment:                       - After reviewing the risks and benefits, the patient                        was deemed in satisfactory condition to undergo the                        procedure.                       After obtaining informed consent, the endoscope was                        passed under direct vision. Throughout the procedure,                        the patient's blood pressure, pulse, and oxygen                        saturations were monitored continuously. The Endoscope                        was introduced through the mouth, and advanced to the                        second part of duodenum. The upper GI endoscopy was                        accomplished without difficulty. The patient tolerated                        the procedure. Findings:      LA Grade C (one or more mucosal breaks continuous between tops of 2 or       more mucosal folds, less than 75% circumference) esophagitis with no       bleeding was found. A guidewire was placed and the scope was withdrawn.       Dilation was performed with a Savary dilator with mild resistance at 15       mm.      The examined duodenum was normal.      A small hiatal hernia was present.      A mild Schatzki ring (acquired) was found in the distal esophagus. Impression:           - LA Grade C reflux esophagitis. Dilated.                       -  Normal examined duodenum.                       - No specimens collected. Recommendation:       - soft food for 3 days, eat slowly, chew well, take                        small bites                       - Perform a colonoscopy as previously scheduled. Scot Junobert T Elliott, MD 07/14/2015 8:36:43 AM This report has been signed electronically. Number of Addenda: 0 Note Initiated On: 07/14/2015 8:17 AM      [facility]

## 2015-07-14 NOTE — OR Nursing (Signed)
BP elevated as prop . anesthia stated that it was ok. Pt to go home and take all of bp meds that he hasn't taken in two days.

## 2015-07-14 NOTE — Op Note (Signed)
[facility] Gastroenterology Patient Name: Joe Mathews Procedure Date: 07/14/2015 8:17 AM MRN: [id number] Account #: [id number] Date of Birth: 12/24/1965 Admit Type: Outpatient Age: 89 Room: [facility] ENDO ROOM 4 Gender: Male Note Status: Finalized Procedure:            Colonoscopy Indications:          Screening for colorectal malignant neoplasm Providers:            Scot Junobert T. Elliott, MD Referring MD:         Barbette ReichmannVishwanath Hande, MD (Referring MD) Medicines:            Propofol per Anesthesia Complications:        No immediate complications. Procedure:            Pre-Anesthesia Assessment:                       - After reviewing the risks and benefits, the patient                        was deemed in satisfactory condition to undergo the                        procedure.                       After obtaining informed consent, the colonoscope was                        passed under direct vision. Throughout the procedure,                        the patient's blood pressure, pulse, and oxygen                        saturations were monitored continuously. The                        Colonoscope was introduced through the anus and                        advanced to the the cecum, identified by appendiceal                        orifice and ileocecal valve. The colonoscopy was                        performed without difficulty. The patient tolerated the                        procedure well. The quality of the bowel preparation                        was adequate to identify polyps. Findings:      Internal hemorrhoids were found during endoscopy. The hemorrhoids were       small and Grade I (internal hemorrhoids that do not prolapse).      A single small angioectasia without bleeding was found in the rectum.      The exam was otherwise without abnormality. Impression:           - Internal hemorrhoids.                       -  A single non-bleeding colonic  angioectasia.                       - The examination was otherwise normal.                       - No specimens collected. Recommendation:       - Await pathology results. Scot Junobert T Elliott, MD 07/14/2015 9:00:37 AM This report has been signed electronically. Number of Addenda: 0 Note Initiated On: 07/14/2015 8:17 AM Scope Withdrawal Time: 0 hours 11 minutes 43 seconds  Total Procedure Duration: 0 hours 18 minutes 32 seconds       [facility]

## 2015-07-14 NOTE — Transfer of Care (Signed)
Immediate Anesthesia Transfer of Care Note  Patient: Annice NeedyClarence M Sadlowski  Procedure(s) Performed: Procedure(s): COLONOSCOPY WITH PROPOFOL (N/A) ESOPHAGOGASTRODUODENOSCOPY (EGD) WITH PROPOFOL (N/A)  Patient Location: PACU  Anesthesia Type:General  Level of Consciousness: awake and alert   Airway & Oxygen Therapy: Patient Spontanous Breathing and Patient connected to nasal cannula oxygen  Post-op Assessment: Report given to RN and Post -op Vital signs reviewed and stable  Post vital signs: Reviewed and stable  Last Vitals:  Filed Vitals:   07/14/15 0734  BP: 170/109  Pulse: 73  Temp: 36.9 C  Resp: 16    Last Pain: There were no vitals filed for this visit.       Complications: No apparent anesthesia complications

## 2015-07-14 NOTE — Anesthesia Procedure Notes (Signed)
Date/Time: 07/14/2015 8:17 AM Performed by: Ginger CarneMICHELET, Shaylon Gillean Pre-anesthesia Checklist: Patient identified, Emergency Drugs available, Suction available, Patient being monitored and Timeout performed Patient Re-evaluated:Patient Re-evaluated prior to inductionOxygen Delivery Method: Nasal cannula

## 2015-07-14 NOTE — Anesthesia Postprocedure Evaluation (Signed)
Anesthesia Post Note  Patient: Annice NeedyClarence M Bekker  Procedure(s) Performed: Procedure(s) (LRB): COLONOSCOPY WITH PROPOFOL (N/A) ESOPHAGOGASTRODUODENOSCOPY (EGD) WITH PROPOFOL (N/A)  Patient location during evaluation: Endoscopy Anesthesia Type: General Level of consciousness: awake and alert Pain management: pain level controlled Vital Signs Assessment: post-procedure vital signs reviewed and stable Respiratory status: spontaneous breathing, nonlabored ventilation, respiratory function stable and patient connected to nasal cannula oxygen Cardiovascular status: blood pressure returned to baseline and stable Postop Assessment: no signs of nausea or vomiting Anesthetic complications: no    Last Vitals:  Filed Vitals:   07/14/15 0945 07/14/15 0950  BP: 176/107 170/99  Pulse: 70 75  Temp:    Resp: 15 13    Last Pain: There were no vitals filed for this visit.               Modesty Rudy S

## 2015-07-14 NOTE — OR Nursing (Signed)
dystolic bp up dr Maisie Fusthomas ordered labetolol q 5 minutes times 4, meds adm at 940, 945, 950, and 955

## 2015-07-14 NOTE — Anesthesia Preprocedure Evaluation (Addendum)
Anesthesia Evaluation  Patient identified by MRN, date of birth, ID band Patient awake    Reviewed: Allergy & Precautions, NPO status , Patient's Chart, lab work & pertinent test results, reviewed documented beta blocker date and time   Airway Mallampati: III  TM Distance: >3 FB     Dental  (+) Chipped   Pulmonary Current Smoker,           Cardiovascular + Peripheral Vascular Disease  + dysrhythmias      Neuro/Psych    GI/Hepatic GERD  ,  Endo/Other  diabetes, Type 2  Renal/GU      Musculoskeletal   Abdominal   Peds  Hematology   Anesthesia Other Findings Hypertensive this am. Lung bx. Anemic 11.4. Hx of PVCs. Lung bx negative. Scleroderma.  Reproductive/Obstetrics                           Anesthesia Physical Anesthesia Plan  ASA: III  Anesthesia Plan: General   Post-op Pain Management:    Induction: Intravenous  Airway Management Planned: Nasal Cannula  Additional Equipment:   Intra-op Plan:   Post-operative Plan:   Informed Consent: I have reviewed the patients History and Physical, chart, labs and discussed the procedure including the risks, benefits and alternatives for the proposed anesthesia with the patient or authorized representative who has indicated his/her understanding and acceptance.     Plan Discussed with: CRNA  Anesthesia Plan Comments:         Anesthesia Quick Evaluation

## 2015-07-16 ENCOUNTER — Encounter: Payer: Self-pay | Admitting: Unknown Physician Specialty

## 2015-12-22 ENCOUNTER — Ambulatory Visit: Payer: Medicare Other

## 2016-01-17 ENCOUNTER — Other Ambulatory Visit: Payer: Self-pay | Admitting: Student

## 2016-01-17 DIAGNOSIS — R1310 Dysphagia, unspecified: Secondary | ICD-10-CM

## 2016-01-23 ENCOUNTER — Ambulatory Visit
Admission: RE | Admit: 2016-01-23 | Discharge: 2016-01-23 | Disposition: A | Discharge status: Home / Self Care | Payer: Medicare Other | Source: Ambulatory Visit | Attending: Student | Admitting: Student

## 2016-01-23 DIAGNOSIS — R1310 Dysphagia, unspecified: Secondary | ICD-10-CM | POA: Diagnosis present

## 2016-01-23 DIAGNOSIS — K222 Esophageal obstruction: Secondary | ICD-10-CM | POA: Diagnosis not present

## 2016-01-29 ENCOUNTER — Ambulatory Visit
Admission: RE | Admit: 2016-01-29 | Discharge: 2016-01-29 | Disposition: A | Discharge status: Home / Self Care | Payer: Medicare Other | Source: Ambulatory Visit | Attending: Unknown Physician Specialty | Admitting: Unknown Physician Specialty

## 2016-01-29 ENCOUNTER — Ambulatory Visit: Payer: Medicare Other | Admitting: Certified Registered Nurse Anesthetist

## 2016-01-29 ENCOUNTER — Encounter: Payer: Self-pay | Admitting: *Deleted

## 2016-01-29 ENCOUNTER — Encounter
Admission: RE | Disposition: A | Discharge status: Home / Self Care | Payer: Self-pay | Source: Ambulatory Visit | Attending: Unknown Physician Specialty

## 2016-01-29 DIAGNOSIS — Z7982 Long term (current) use of aspirin: Secondary | ICD-10-CM | POA: Diagnosis not present

## 2016-01-29 DIAGNOSIS — Z809 Family history of malignant neoplasm, unspecified: Secondary | ICD-10-CM | POA: Diagnosis not present

## 2016-01-29 DIAGNOSIS — Z9981 Dependence on supplemental oxygen: Secondary | ICD-10-CM | POA: Insufficient documentation

## 2016-01-29 DIAGNOSIS — N183 Chronic kidney disease, stage 3 (moderate): Secondary | ICD-10-CM | POA: Insufficient documentation

## 2016-01-29 DIAGNOSIS — K2970 Gastritis, unspecified, without bleeding: Secondary | ICD-10-CM | POA: Insufficient documentation

## 2016-01-29 DIAGNOSIS — Z888 Allergy status to other drugs, medicaments and biological substances status: Secondary | ICD-10-CM | POA: Diagnosis not present

## 2016-01-29 DIAGNOSIS — M349 Systemic sclerosis, unspecified: Secondary | ICD-10-CM | POA: Diagnosis not present

## 2016-01-29 DIAGNOSIS — Z7984 Long term (current) use of oral hypoglycemic drugs: Secondary | ICD-10-CM | POA: Diagnosis not present

## 2016-01-29 DIAGNOSIS — E1122 Type 2 diabetes mellitus with diabetic chronic kidney disease: Secondary | ICD-10-CM | POA: Insufficient documentation

## 2016-01-29 DIAGNOSIS — Z87891 Personal history of nicotine dependence: Secondary | ICD-10-CM | POA: Diagnosis not present

## 2016-01-29 DIAGNOSIS — I739 Peripheral vascular disease, unspecified: Secondary | ICD-10-CM | POA: Diagnosis not present

## 2016-01-29 DIAGNOSIS — J8410 Pulmonary fibrosis, unspecified: Secondary | ICD-10-CM | POA: Insufficient documentation

## 2016-01-29 DIAGNOSIS — Z833 Family history of diabetes mellitus: Secondary | ICD-10-CM | POA: Insufficient documentation

## 2016-01-29 DIAGNOSIS — I129 Hypertensive chronic kidney disease with stage 1 through stage 4 chronic kidney disease, or unspecified chronic kidney disease: Secondary | ICD-10-CM | POA: Insufficient documentation

## 2016-01-29 DIAGNOSIS — L943 Sclerodactyly: Secondary | ICD-10-CM | POA: Insufficient documentation

## 2016-01-29 DIAGNOSIS — K222 Esophageal obstruction: Secondary | ICD-10-CM | POA: Insufficient documentation

## 2016-01-29 DIAGNOSIS — K210 Gastro-esophageal reflux disease with esophagitis: Secondary | ICD-10-CM | POA: Diagnosis not present

## 2016-01-29 DIAGNOSIS — I7300 Raynaud's syndrome without gangrene: Secondary | ICD-10-CM | POA: Diagnosis not present

## 2016-01-29 DIAGNOSIS — R1310 Dysphagia, unspecified: Secondary | ICD-10-CM | POA: Diagnosis present

## 2016-01-29 HISTORY — DX: Raynaud's syndrome without gangrene: I73.00

## 2016-01-29 HISTORY — PX: ESOPHAGOGASTRODUODENOSCOPY (EGD) WITH PROPOFOL: SHX5813

## 2016-01-29 HISTORY — PX: SAVORY DILATION: SHX5439

## 2016-01-29 HISTORY — DX: Esophageal obstruction: K22.2

## 2016-01-29 HISTORY — DX: Chronic kidney disease, unspecified: N18.9

## 2016-01-29 SURGERY — ESOPHAGOGASTRODUODENOSCOPY (EGD) WITH PROPOFOL
Anesthesia: General

## 2016-01-29 MED ORDER — SODIUM CHLORIDE 0.9 % IV SOLN
INTRAVENOUS | Status: DC
  Administered 2016-01-29: 1000 mL via INTRAVENOUS

## 2016-01-29 MED ORDER — GLYCOPYRROLATE 0.2 MG/ML IJ SOLN
INTRAMUSCULAR | Status: DC | PRN
  Administered 2016-01-29: 0.2 mg via INTRAVENOUS

## 2016-01-29 MED ORDER — PROPOFOL 500 MG/50ML IV EMUL
INTRAVENOUS | Status: DC | PRN
  Administered 2016-01-29: 180 ug/kg/min via INTRAVENOUS

## 2016-01-29 MED ORDER — SODIUM CHLORIDE 0.9 % IV SOLN: INTRAVENOUS | Status: DC

## 2016-01-29 MED ORDER — PROPOFOL 10 MG/ML IV BOLUS
INTRAVENOUS | Status: DC | PRN
  Administered 2016-01-29: 30 mg via INTRAVENOUS
  Administered 2016-01-29: 100 mg via INTRAVENOUS

## 2016-01-29 MED ORDER — PROPOFOL 500 MG/50ML IV EMUL
INTRAVENOUS | Status: AC
  Filled 2016-01-29: qty 50

## 2016-01-29 MED ORDER — LIDOCAINE HCL (CARDIAC) 20 MG/ML IV SOLN
INTRAVENOUS | Status: DC | PRN
  Administered 2016-01-29: 50 mg via INTRAVENOUS

## 2016-01-29 MED ORDER — LIDOCAINE HCL (PF) 2 % IJ SOLN
INTRAMUSCULAR | Status: AC
  Filled 2016-01-29: qty 2

## 2016-01-29 MED ORDER — PROPOFOL 10 MG/ML IV BOLUS
INTRAVENOUS | Status: AC
  Filled 2016-01-29: qty 20

## 2016-01-29 MED ORDER — GLYCOPYRROLATE 0.2 MG/ML IJ SOLN
INTRAMUSCULAR | Status: AC
  Filled 2016-01-29: qty 1

## 2016-01-29 NOTE — Op Note (Signed)
[facility] Gastroenterology Patient Name: Joe Mathews Procedure Date: 01/29/2016 10:06 AM MRN: [id number] Account #: [id number] Date of Birth: 08-14-1965 Admit Type: Outpatient Age: 51 Room: [facility] ENDO ROOM 1 Gender: Male Note Status: Finalized Procedure:            Upper GI endoscopy Indications:          Dysphagia Providers:            Scot Jun, MD Referring MD:         Barbette Reichmann, MD (Referring MD) Medicines:            Propofol per Anesthesia Complications:        No immediate complications. Procedure:            Pre-Anesthesia Assessment:                       - After reviewing the risks and benefits, the patient                        was deemed in satisfactory condition to undergo the                        procedure.                       After obtaining informed consent, the endoscope was                        passed under direct vision. Throughout the procedure,                        the patient's blood pressure, pulse, and oxygen                        saturations were monitored continuously. The Endoscope                        was introduced through the mouth, and advanced to the                        second part of duodenum. The upper GI endoscopy was                        somewhat difficult due to the patient's oxygen                        desaturation. Successful completion of the procedure                        was aided by performing chin lift. Findings:      LA Grade C (one or more mucosal breaks continuous between tops of 2 or       more mucosal folds, less than 75% circumference) esophagitis with no       bleeding was found.      A moderate Schatzki ring (acquired) was found at the gastroesophageal       junction. A guidewire was placed and the scope was withdrawn. Dilation       was performed with a Savary dilator with mild resistance at 15 mm and 16       mm.  Diffuse and patchy mild inflammation  characterized by erythema and       granularity was found in the gastric body and in the gastric antrum.      The examined duodenum was normal. Impression:           - LA Grade C reflux esophagitis.                       - Moderate Schatzki ring. Dilated.                       - Gastritis.                       - Normal examined duodenum.                       - No specimens collected. Recommendation:       Start Omeprazole 40 mg a day and Zantac at night                       - soft food for 3 days, eat slowly, chew well, take                        small bites Scot Junobert T Lovely Kerins, MD 01/29/2016 10:26:31 AM This report has been signed electronically. Number of Addenda: 0 Note Initiated On: 01/29/2016 10:06 AM      [facility]

## 2016-01-29 NOTE — Anesthesia Postprocedure Evaluation (Signed)
Anesthesia Post Note  Patient: Joe NeedyClarence M Mathews  Procedure(s) Performed: Procedure(s) (LRB): ESOPHAGOGASTRODUODENOSCOPY (EGD) WITH PROPOFOL (N/A) SAVORY DILATION (N/A)  Patient location during evaluation: Endoscopy Anesthesia Type: General Level of consciousness: awake and alert Pain management: pain level controlled Vital Signs Assessment: post-procedure vital signs reviewed and stable Respiratory status: spontaneous breathing, nonlabored ventilation, respiratory function stable and patient connected to nasal cannula oxygen Cardiovascular status: blood pressure returned to baseline and stable Postop Assessment: no signs of nausea or vomiting Anesthetic complications: no     Last Vitals:  Vitals:   01/29/16 1049 01/29/16 1059  BP: 125/90 (!) 134/94  Pulse: 87 88  Resp: 14 16  Temp:      Last Pain:  Vitals:   01/29/16 1029  TempSrc: Tympanic                 Lenard SimmerAndrew Nihal Doan

## 2016-01-29 NOTE — Anesthesia Post-op Follow-up Note (Cosign Needed)
Anesthesia QCDR form completed.        

## 2016-01-29 NOTE — Anesthesia Preprocedure Evaluation (Signed)
Anesthesia Evaluation  Patient identified by MRN, date of birth, ID band Patient awake    Reviewed: Allergy & Precautions, NPO status , Patient's Chart, lab work & pertinent test results, reviewed documented beta blocker date and time   Airway Mallampati: III  TM Distance: >3 FB     Dental  (+) Chipped   Pulmonary Current Smoker, former smoker,           Cardiovascular + Peripheral Vascular Disease  + dysrhythmias      Neuro/Psych    GI/Hepatic GERD  ,  Endo/Other  diabetes, Type 2  Renal/GU      Musculoskeletal   Abdominal   Peds  Hematology   Anesthesia Other Findings Hypertensive this am. Lung bx. Anemic 11.4. Hx of PVCs. Lung bx negative. Scleroderma.  Reproductive/Obstetrics                             Anesthesia Physical  Anesthesia Plan  ASA: III  Anesthesia Plan: General   Post-op Pain Management:    Induction: Intravenous  Airway Management Planned: Nasal Cannula  Additional Equipment:   Intra-op Plan:   Post-operative Plan:   Informed Consent: I have reviewed the patients History and Physical, chart, labs and discussed the procedure including the risks, benefits and alternatives for the proposed anesthesia with the patient or authorized representative who has indicated his/her understanding and acceptance.     Plan Discussed with: CRNA  Anesthesia Plan Comments:         Anesthesia Quick Evaluation

## 2016-01-29 NOTE — Transfer of Care (Signed)
Immediate Anesthesia Transfer of Care Note  Patient: Joe Mathews  Procedure(s) Performed: Procedure(s): ESOPHAGOGASTRODUODENOSCOPY (EGD) WITH PROPOFOL (N/A) SAVORY DILATION (N/A)  Patient Location: PACU  Anesthesia Type:General  Level of Consciousness: awake, alert , oriented and patient cooperative  Airway & Oxygen Therapy: Patient Spontanous Breathing and Patient connected to nasal cannula oxygen  Post-op Assessment: Report given to RN and Post -op Vital signs reviewed and stable  Post vital signs: Reviewed and stable  Last Vitals:  Vitals:   01/29/16 0934 01/29/16 1029  BP: (!) 149/90 (!) 134/91  Pulse: 93 94  Resp: 16 (!) 21  Temp: 36.8 C 36.6 C    Last Pain:  Vitals:   01/29/16 1029  TempSrc: Tympanic         Complications: No apparent anesthesia complications

## 2016-01-29 NOTE — H&P (Signed)
Primary Care Physician:  Barbette ReichmannHANDE,VISHWANATH, MD Primary Gastroenterologist:  Dr. Mechele CollinElliott  Pre-Procedure History & Physical: HPI:  Joe Mathews is a 51 y.o. male is here for an endoscopy.   Past Medical History:  Diagnosis Date  . Chronic kidney disease    stage 3  . Diabetes mellitus without complication (HCC)   . Dysphagia   . Dysrhythmia   . Esophagitis   . GERD (gastroesophageal reflux disease)   . Pulmonary fibrosis (HCC)   . Pulmonary hypertension   . Raynaud disease   . Raynaud's disease   . Schatzki's ring   . Sclerodactyly   . Scleroderma [facility](HCC)     Past Surgical History:  Procedure Laterality Date  . COLONOSCOPY WITH PROPOFOL N/A 07/14/2015   Procedure: COLONOSCOPY WITH PROPOFOL;  Surgeon: Scot Junobert T Elliott, MD;  [facility];  Service: Endoscopy;  Laterality: N/A;  . ESOPHAGOGASTRODUODENOSCOPY (EGD) WITH PROPOFOL N/A 04/28/2015   Procedure: ESOPHAGOGASTRODUODENOSCOPY (EGD) WITH PROPOFOL;  Surgeon: Elnita MaxwellMatthew Gordon Rein, MD;  [facility] END[facility]COPY;  Service: Endoscopy;  Laterality: N/A;  . ESOPHAGOGASTRODUODENOSCOPY (EGD) WITH PROPOFOL N/A 07/14/2015   Procedure: ESOPHAGOGASTRODUODENOSCOPY (EGD) WITH PROPOFOL;  Surgeon: Scot Junobert T Elliott, MD;  [facility];  Service: Endoscopy;  Laterality: N/A;  . LUNG BIOPSY    . RENAL BIOPSY    . UPPER GI ENDOSCOPY      Prior to Admission medications   Medication Sig Start Date End Date Taking? Authorizing Provider  ADCIRCA 20 MG TABS Take 1 tablet by mouth 2 (two) times daily. 06/12/15  Yes Historical Provider, MD  doxazosin (CARDURA) 4 MG tablet Take 4 mg by mouth daily.   Yes Historical Provider, MD  Macitentan (OPSUMIT) 10 MG TABS Take 10 mg by mouth daily.   Yes Historical Provider, MD  meloxicam (MOBIC) 7.5 MG tablet Take 7.5 mg by mouth daily.   Yes Historical Provider, MD  mycophenolate (CELLCEPT) 500 MG tablet Take 1,500 mg by mouth 2 (two) times daily.   Yes Historical Provider, MD  OXYGEN  Inhale 2 L/min into the lungs continuous.   Yes Historical Provider, MD  valsartan (DIOVAN) 320 MG tablet Take 320 mg by mouth daily.   Yes Historical Provider, MD  aspirin EC 81 MG tablet Take 81 mg by mouth daily.    Historical Provider, MD  cloNIDine (CATAPRES - DOSED IN MG/24 HR) 0.2 mg/24hr patch Place 0.2 mg onto the skin once a week.    Historical Provider, MD  furosemide (LASIX) 40 MG tablet Take 40 mg by mouth daily as needed.     Historical Provider, MD  glimepiride (AMARYL) 4 MG tablet Take 4 mg by mouth daily with breakfast.    Historical Provider, MD  linagliptin (TRADJENTA) 5 MG TABS tablet Take 5 mg by mouth daily.    Historical Provider, MD  Liraglutide (VICTOZA) 18 MG/3ML SOPN Inject 1.8 mg into the skin daily.    Historical Provider, MD  metFORMIN (GLUCOPHAGE) 1000 MG tablet Take 1,000 mg by mouth 2 (two) times daily with a meal.    Historical Provider, MD  NIFEdipine (ADALAT CC) 90 MG 24 hr tablet Take 90 mg by mouth daily.    Historical Provider, MD  ranitidine (ZANTAC) 300 MG tablet Take 300 mg by mouth daily before breakfast.    Historical Provider, MD  tadalafil (CIALIS) 20 MG tablet Take 40 mg by mouth daily.    Historical Provider, MD    Allergies as of 01/26/2016 - Review Complete 01/26/2016  Allergen Reaction Noted  .  Protonix [pantoprazole sodium]  04/27/2015    Family History  Problem Relation Age of Onset  . Cancer Mother   . Diabetes Mellitus II Mother   . Diabetes Mellitus II Father     Social History   Social History  . Marital status: Married    Spouse name: N/A  . Number of children: N/A  . Years of education: N/A   Occupational History  . Not on file.   Social History Main Topics  . Smoking status: Former Smoker    Packs/day: 0.25    Types: Cigarettes    Quit date: 12/19/2015  . Smokeless tobacco: Former Neurosurgeon    Quit date: 07/13/1985  . Alcohol use No  . Drug use: No  . Sexual activity: Not on file   Other Topics Concern  . Not on  file   Social History Narrative  . No narrative on file    Review of Systems: See HPI, otherwise negative ROS  Physical Exam: BP (!) 149/90   Pulse 93   Temp 98.2 F (36.8 C) (Tympanic)   Resp 16   Ht 6\' 2"  (1.88 m)   Wt 129.3 kg (285 lb)   SpO2 99%   BMI 36.59 kg/m  General:   Alert,  pleasant and cooperative in NAD Head:  Normocephalic and atraumatic. Neck:  Supple; no masses or thyromegaly. Lungs:  Clear throughout to auscultation.    Heart:  Regular rate and rhythm. Abdomen:  Soft, nontender and nondistended. Normal bowel sounds, without guarding, and without rebound.   Neurologic:  Alert and  oriented x4;  grossly normal neurologically.  Impression/Plan: Joe Mathews is here for an endoscopy to be performed for dysphagia and esophageal ring  Risks, benefits, limitations, and alternatives regarding  endoscopy have been reviewed with the patient.  Questions have been answered.  All parties agreeable.   Lynnae Prude, MD  01/29/2016, 10:00 AM

## 2016-01-30 ENCOUNTER — Encounter: Payer: Self-pay | Admitting: Unknown Physician Specialty

## 2016-05-21 ENCOUNTER — Other Ambulatory Visit: Payer: Self-pay | Admitting: Specialist

## 2016-05-21 DIAGNOSIS — M349 Systemic sclerosis, unspecified: Secondary | ICD-10-CM

## 2016-05-21 DIAGNOSIS — R918 Other nonspecific abnormal finding of lung field: Secondary | ICD-10-CM

## 2016-05-23 ENCOUNTER — Other Ambulatory Visit
Admission: RE | Admit: 2016-05-23 | Discharge: 2016-05-23 | Disposition: A | Discharge status: Home / Self Care | Payer: Medicare Other | Source: Ambulatory Visit | Attending: Specialist | Admitting: Specialist

## 2016-05-23 ENCOUNTER — Ambulatory Visit
Admission: RE | Admit: 2016-05-23 | Discharge: 2016-05-23 | Disposition: A | Discharge status: Home / Self Care | Payer: Medicare Other | Source: Ambulatory Visit | Attending: Specialist | Admitting: Specialist

## 2016-05-23 DIAGNOSIS — J849 Interstitial pulmonary disease, unspecified: Secondary | ICD-10-CM | POA: Insufficient documentation

## 2016-05-23 DIAGNOSIS — M349 Systemic sclerosis, unspecified: Secondary | ICD-10-CM | POA: Diagnosis not present

## 2016-05-23 DIAGNOSIS — R918 Other nonspecific abnormal finding of lung field: Secondary | ICD-10-CM

## 2016-05-23 HISTORY — DX: Systemic involvement of connective tissue, unspecified: M35.9

## 2016-05-23 LAB — CREATININE, SERUM
Creatinine, Ser: 1.55 mg/dL — ABNORMAL HIGH (ref 0.61–1.24)
GFR calc Af Amer: 58 mL/min — ABNORMAL LOW (ref >60)
GFR calc non Af Amer: 50 mL/min — ABNORMAL LOW (ref >60)

## 2016-05-23 MED ORDER — IOPAMIDOL (ISOVUE-300) INJECTION 61%
75.0000 mL | Freq: Once | INTRAVENOUS | Status: AC | PRN
  Administered 2016-05-23: 60 mL via INTRAVENOUS

## 2016-10-16 ENCOUNTER — Other Ambulatory Visit: Payer: Self-pay | Admitting: Specialist

## 2016-10-16 DIAGNOSIS — J8410 Pulmonary fibrosis, unspecified: Secondary | ICD-10-CM

## 2016-10-16 DIAGNOSIS — R0602 Shortness of breath: Secondary | ICD-10-CM

## 2016-10-16 DIAGNOSIS — M349 Systemic sclerosis, unspecified: Secondary | ICD-10-CM

## 2016-10-30 ENCOUNTER — Ambulatory Visit
Admission: RE | Admit: 2016-10-30 | Discharge: 2016-10-30 | Disposition: A | Discharge status: Home / Self Care | Payer: Medicare Other | Source: Ambulatory Visit | Attending: Specialist | Admitting: Specialist

## 2016-10-30 DIAGNOSIS — M349 Systemic sclerosis, unspecified: Secondary | ICD-10-CM | POA: Diagnosis present

## 2016-10-30 DIAGNOSIS — R0602 Shortness of breath: Secondary | ICD-10-CM

## 2016-10-30 DIAGNOSIS — J439 Emphysema, unspecified: Secondary | ICD-10-CM | POA: Insufficient documentation

## 2016-10-30 DIAGNOSIS — I272 Pulmonary hypertension, unspecified: Secondary | ICD-10-CM | POA: Diagnosis not present

## 2016-10-30 DIAGNOSIS — J8410 Pulmonary fibrosis, unspecified: Secondary | ICD-10-CM | POA: Insufficient documentation

## 2016-11-08 ENCOUNTER — Encounter: Payer: Medicare Other | Attending: Internal Medicine | Admitting: Dietician

## 2016-11-08 ENCOUNTER — Encounter: Payer: Self-pay | Admitting: Dietician

## 2016-11-08 VITALS — Ht 74.0 in | Wt 294.5 lb

## 2016-11-08 DIAGNOSIS — Z713 Dietary counseling and surveillance: Secondary | ICD-10-CM | POA: Diagnosis not present

## 2016-11-08 DIAGNOSIS — Z6837 Body mass index (BMI) 37.0-37.9, adult: Secondary | ICD-10-CM

## 2016-11-08 DIAGNOSIS — R634 Abnormal weight loss: Secondary | ICD-10-CM | POA: Insufficient documentation

## 2016-11-08 DIAGNOSIS — E6609 Other obesity due to excess calories: Secondary | ICD-10-CM

## 2016-11-08 NOTE — Patient Instructions (Addendum)
   Try to cut back on juice consumption to help with improved blood sugar control. Choose water or flavored water (MiYo, Crystal light) instead  Buy snack foods in smaller portion sizes, or portion them out into smaller snack bags after purchasing. You could also try buying lower calorie/ fat snack foods. Kind bars are a great option  Try [employer] dressing or a fat free [place] / [place]alsamic  Start to re-integrate a lunch meal each day. This can be as simple as a smoothie or salad  Aim for at least 30 min/day of exercise each day. Ultimate goal would be 150 min.week of physical activity

## 2016-11-08 NOTE — Progress Notes (Signed)
Medical Nutrition Therapy: Visit start time: 0830  end time: 0930  Assessment:  Diagnosis: Weight loss Past medical history: HTN, DM type II, pulmonary fibrosis, Schatziki's ring Psychosocial issues/ stress concerns: none Preferred learning method:  . Auditory . Visual . Hands-on  Current weight: 294.5lb  Height: 6\' 2"   Medications, supplements: metformin, tradjenta, victoza, lasix, diovan, see chart for full list  Progress and evaluation: Patient desires weight loss in order to improve BG readings. Goal wt 245#. Reports h/o 65# wt loss in under 1 year, 2483yrs ago during a weight loss challenge at work. States weight was lost very quickly via interventions such as eating small frequent meals, eating a high protein/ low carb diet, decreasing portion sizes, making smoothies for breakfast daily, and walking up to 3hrs/day. Weight was slowly regained once the challenge ended. Describes knowing how to lose weight but is not as motivated without an end goal or set plan to follow. Does not follow a diabetic diet. Wife keeps snack foods in the house which he reports are difficult not to eat throughout the day. The family rarely prepares fried foods and chooses whole grain bread.   Physical activity: Trying to get back to walking regularly again. Activity has decreased overall since retirement  Dietary Intake:  Usual eating pattern includes 2 meals and 2-4 snacks per day. Dining out frequency: 2 meals per week.  Breakfast: grits + eggs, cereal with 2% milk, oatmeal, hard boiled egg, toast Snack: - Lunch: - Snack: chips, fruit, yogurt and other snack foods Supper: PB&J or egg sandwich, Spanish rice and beans. Wife prepares a full dinner on the weekends Snack: fruit, cup of ice cream, candy Beverages: water, fruit juice, soda seldomly   Nutrition Care Education: Topics covered: sugar sweetened beverages, portions, strategies to avoid weight gain during the holidays, portion control with snacks and  when out to eat, choosing lower fat foods, weekly exercise recommendations for weight reduction and improved BG control Basic nutrition: basic food groups, appropriate nutrient balance, appropriate meal and snack schedule, general nutrition guidelines    Weight control: benefits of weight control, behavioral changes for weight loss Advanced nutrition: cooking techniques, dining out, food label reading Diabetes: appropriate meal and snack schedule, role of fiber, weight loss Other lifestyle changes:  benefits of making changes, increasing motivation, readiness for change, identifying habits that need to change, alcohol use  Nutritional Diagnosis:  [place]-3.3 Overweight/obesity As related to lifestyle habits.  As evidenced by BMI 37.81.  Intervention: Discussion as noted above. Patient was provided a list of goals to work on until next session, starting with consuming less sugar-sweetened beverages and trying to re-incorporate a lunch meal to reduce the number of calories coming from snacking throughout the day. Will provide a general meal plan and recipe ideas at follow up session on Dec 7th, 2018.  Education Materials given: Marland Kitchen. Tips for successful weight loss handout . Quick and healthy meals handout . Goals/ instructions  Learner/ who was taught:  . Patient   Level of understanding: Marland Kitchen. Verbalizes/ demonstrates competency  Demonstrated degree of understanding via:   Teach back Learning barriers: . None  Willingness to learn/ readiness for change: . Acceptance, ready for change  Monitoring and Evaluation:  Dietary intake, exercise, blood glucose readings, and body weight      follow up: in 5 week(s) (December 7th @8 :30am)

## 2016-11-22 ENCOUNTER — Ambulatory Visit (INDEPENDENT_AMBULATORY_CARE_PROVIDER_SITE_OTHER): Payer: Medicare Other | Admitting: Urology

## 2016-11-22 VITALS — BP 170/83 | HR 114 | Ht 74.0 in | Wt 288.9 lb

## 2016-11-22 DIAGNOSIS — N529 Male erectile dysfunction, unspecified: Secondary | ICD-10-CM

## 2016-11-22 DIAGNOSIS — N430 Encysted hydrocele: Secondary | ICD-10-CM | POA: Diagnosis not present

## 2016-11-22 LAB — URINALYSIS, COMPLETE
BILIRUBIN UA: NEGATIVE
Glucose, UA: NEGATIVE
KETONES UA: NEGATIVE
LEUKOCYTES UA: NEGATIVE
Nitrite, UA: NEGATIVE
PH UA: 5 (ref 5.0–7.5)
Specific Gravity, UA: >1.03 — ABNORMAL HIGH (ref 1.005–1.030)
Urobilinogen, Ur: 0.2 mg/dL (ref 0.2–1.0)

## 2016-11-22 LAB — MICROSCOPIC EXAMINATION
EPITHELIAL CELLS (NON RENAL): NONE SEEN /HPF (ref 0–10)
WBC, UA: NONE SEEN /hpf (ref 0–?)

## 2016-11-22 NOTE — Progress Notes (Signed)
11/22/2016 10:39 AM   [name] 24-Oct-1965 [id number]  Referring provider: Barbette Reichmann, MD [address] [facility] [place], [place] [address]  CC: Scrotal swelling  HPI: The patient is a 51 year old gentleman with a past medical history of erectile patient status post IPP placement years ago presents today for scrotal swelling.  He noticed this a few weeks ago.  It does not bother him.  It does not hurt.  He has no drainage.  His IPP functions well.  He has no pain using his IPP or cycling it.  He is not bothered by this, but is concerned it may be cancer.   PMH: Past Medical History:  Diagnosis Date  . Chronic kidney disease    stage 3  . Collagen vascular disease (HCC)   . Diabetes mellitus without complication (HCC)   . Dysphagia   . Dysrhythmia   . Esophagitis   . GERD (gastroesophageal reflux disease)   . Pulmonary fibrosis (HCC)   . Pulmonary hypertension (HCC)   . Raynaud disease   . Raynaud's disease   . Schatzki's ring   . Sclerodactyly   . Scleroderma [facility])     Surgical History: Past Surgical History:  Procedure Laterality Date  . COLONOSCOPY WITH PROPOFOL N/A 07/14/2015   Performed by Scot Jun, MD at [facility] ENDOSCOPY  . ESOPHAGOGASTRODUODENOSCOPY (EGD) WITH PROPOFOL N/A 01/29/2016   Performed by Scot Jun, MD at [facility] ENDOSCOPY  . ESOPHAGOGASTRODUODENOSCOPY (EGD) WITH PROPOFOL N/A 07/14/2015   Performed by Scot Jun, MD at [facility] ENDOSCOPY  . ESOPHAGOGASTRODUODENOSCOPY (EGD) WITH PROPOFOL N/A 04/28/2015   Performed by Elnita Maxwell, MD at [facility] ENDOSCOPY  . LUNG BIOPSY    . RENAL BIOPSY    . SAVORY DILATION N/A 01/29/2016   Performed by Scot Jun, MD at [facility] ENDOSCOPY  . UPPER GI ENDOSCOPY      Home Medications:  Allergies as of 11/22/2016      Reactions   Protonix [pantoprazole Sodium]       Medication List        Accurate as of 11/22/16 10:39 AM. Always use your most recent med  list.          ADCIRCA 20 MG tablet Generic drug:  tadalafil (PAH) Take 1 tablet by mouth 2 (two) times daily.   aspirin EC 81 MG tablet Take 81 mg by mouth daily.   cloNIDine 0.2 mg/24hr patch Commonly known as:  CATAPRES - Dosed in mg/24 hr Place 0.2 mg onto the skin once a week.   doxazosin 4 MG tablet Commonly known as:  CARDURA Take 4 mg by mouth daily.   furosemide 40 MG tablet Commonly known as:  LASIX Take 40 mg by mouth daily as needed.   glimepiride 4 MG tablet Commonly known as:  AMARYL Take 4 mg by mouth daily with breakfast.   linagliptin 5 MG Tabs tablet Commonly known as:  TRADJENTA Take 5 mg by mouth daily.   meloxicam 7.5 MG tablet Commonly known as:  MOBIC Take 7.5 mg by mouth daily.   metFORMIN 1000 MG tablet Commonly known as:  GLUCOPHAGE Take 1,000 mg by mouth 2 (two) times daily with a meal.   mycophenolate 500 MG tablet Commonly known as:  CELLCEPT Take 1,500 mg by mouth 2 (two) times daily.   NIFEdipine 90 MG 24 hr tablet Commonly known as:  ADALAT CC Take 90 mg by mouth daily.   OPSUMIT 10 MG Tabs Generic drug:  Macitentan Take 10 mg by mouth daily.   OXYGEN Inhale 2 L/min into the lungs continuous.   ranitidine 300 MG tablet Commonly known as:  ZANTAC Take 300 mg by mouth daily before breakfast.   valsartan 320 MG tablet Commonly known as:  DIOVAN Take 320 mg by mouth daily.   VICTOZA 18 MG/3ML Sopn Generic drug:  liraglutide Inject 1.8 mg into the skin daily.       Allergies:  Allergies  Allergen Reactions  . Protonix [Pantoprazole Sodium]     Family History: Family History  Problem Relation Age of Onset  . Cancer Mother   . Diabetes Mellitus II Mother   . Diabetes Mellitus II Father     Social History:  reports that he has been smoking cigarettes.  He has been smoking about 0.25 packs per day. He quit smokeless tobacco use about 31 years ago. He reports that he does not drink alcohol or use  drugs.  ROS: UROLOGY Frequent Urination?: Yes Hard to postpone urination?: No Burning/pain with urination?: No Get up at night to urinate?: Yes Leakage of urine?: No Urine stream starts and stops?: No Trouble starting stream?: No Do you have to strain to urinate?: No Blood in urine?: No Urinary tract infection?: No Sexually transmitted disease?: No Injury to kidneys or bladder?: No Painful intercourse?: No Weak stream?: No Erection problems?: No Penile pain?: No  Gastrointestinal Nausea?: No Vomiting?: No Indigestion/heartburn?: Yes Diarrhea?: No Constipation?: No  Constitutional Fever: No Night sweats?: No Weight loss?: No Fatigue?: No  Skin Skin rash/lesions?: No Itching?: No  Eyes Blurred vision?: No Double vision?: No  Ears/Nose/Throat Sore throat?: No Sinus problems?: Yes  Hematologic/Lymphatic Swollen glands?: No Easy bruising?: No  Cardiovascular Leg swelling?: No Chest pain?: No  Respiratory Cough?: No Shortness of breath?: Yes  Endocrine Excessive thirst?: No  Musculoskeletal Back pain?: No Joint pain?: No  Neurological Headaches?: No Dizziness?: No  Psychologic Depression?: No Anxiety?: No  Physical Exam: BP (!) 170/83 (BP Location: Right Arm, Patient Position: Sitting, Cuff Size: Large)   Pulse (!) 114   Ht 6\' 2"  (1.88 m)   Wt 288 lb 14.4 oz (131 kg)   BMI 37.09 kg/m   Constitutional:  Alert and oriented, No acute distress. HEENT: [place] AT, moist mucus membranes.  Trachea midline, no masses. Cardiovascular: No clubbing, cyanosis, or edema. Respiratory: Normal respiratory effort, no increased work of breathing. GI: Abdomen is soft, nontender, nondistended, no abdominal masses GU: No CVA tenderness.  Normal phallus.  IPP cylinders palpable.  Left 4-5 cm hydrocele.  Nontender to palpation.  Normal right testicle.  IPP pump in appropriate location.  Exam nontender.  Noninfectious.  No sign of edema.  No sign of infection of his  IPP. Skin: No rashes, bruises or suspicious lesions. Lymph: No cervical or inguinal adenopathy. Neurologic: Grossly intact, no focal deficits, moving all 4 extremities. Psychiatric: Normal mood and affect.  Laboratory Data: Lab Results  Component Value Date   WBC 8.6 06/17/2015   HGB 11.4 (L) 06/17/2015   HCT 36.3 (L) 06/17/2015   MCV 83.1 06/17/2015   PLT 250 06/17/2015    Lab Results  Component Value Date   CREATININE 1.55 (H) 05/23/2016    No results found for: PSA  No results found for: TESTOSTERONE  Lab Results  Component Value Date   HGBA1C 7.2 (H) 07/16/2013    Urinalysis No results found for: COLORURINE, APPEARANCEUR, LABSPEC, PHURINE, GLUCOSEU, HGBUR, BILIRUBINUR, KETONESUR, PROTEINUR, UROBILINOGEN, NITRITE, LEUKOCYTESUR    Assessment &  Plan:    1.  Left hydrocele I discussed with the patient that he has a left hydrocele.  Since he is not bothered by this at this time, I recommended no intervention.  I also shared my concern that any intervention puts him at risk of infection of his prosthesis which would require removal.  Potentially if his IPP mechanically fails in the future, I would recommend addressing his hydrocele at the same time of replacing his IPP.  2.  ED Well-controlled with IPP.   Return if symptoms worsen or fail to improve.  Hildred LaserBrian James Arturo Sofranko, MD  [facility] [address], [address] [place], [place] [id number] [phone]

## 2016-12-13 ENCOUNTER — Ambulatory Visit: Payer: Medicare Other | Admitting: Dietician

## 2017-06-27 ENCOUNTER — Other Ambulatory Visit: Payer: Self-pay | Admitting: Nephrology

## 2017-06-27 DIAGNOSIS — N183 Chronic kidney disease, stage 3 unspecified: Secondary | ICD-10-CM

## 2017-07-03 ENCOUNTER — Ambulatory Visit
Admission: RE | Admit: 2017-07-03 | Discharge: 2017-07-03 | Disposition: A | Discharge status: Home / Self Care | Payer: Medicare Other | Source: Ambulatory Visit | Attending: Nephrology | Admitting: Nephrology

## 2017-07-03 DIAGNOSIS — N183 Chronic kidney disease, stage 3 unspecified: Secondary | ICD-10-CM

## 2017-07-03 DIAGNOSIS — N281 Cyst of kidney, acquired: Secondary | ICD-10-CM | POA: Diagnosis not present

## 2017-07-05 IMAGING — CT CT CHEST W/O CM
3 of 5 series · 16 of 33 positions shown, 17 images · non-contrast
Comparison: No prior chest CT.

CLINICAL DATA: Shortness of breath.  Pulmonary fibrosis.

EXAM:
CT CHEST WITHOUT CONTRAST
TECHNIQUE: Multidetector CT imaging of the chest was performed following the
standard protocol without IV contrast.

[Series 2: routine chest wo · axial · 0.78mm/px · z∈[-60,+66]mm · 4 of 69 slices shown]
[im 9/69  lung]
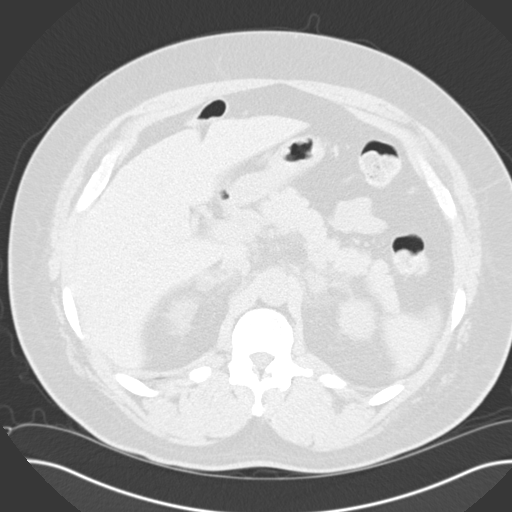
[im 18/69  lung]
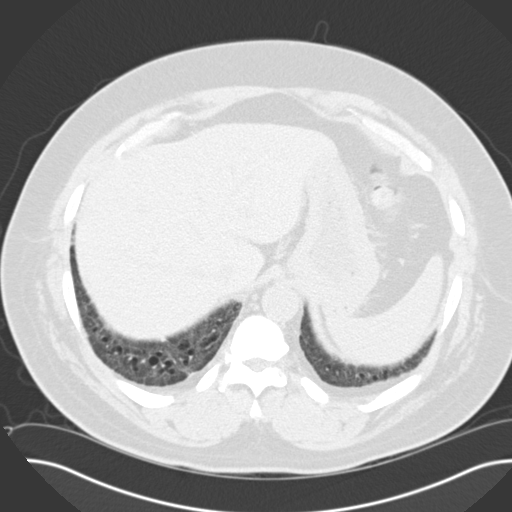
[im 26/69  lung]
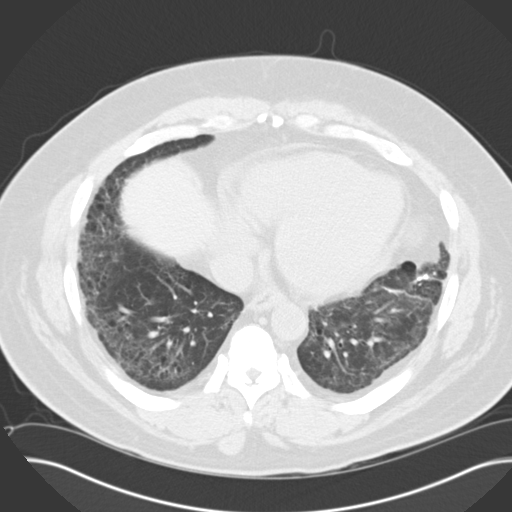
[im 34/69  lung]
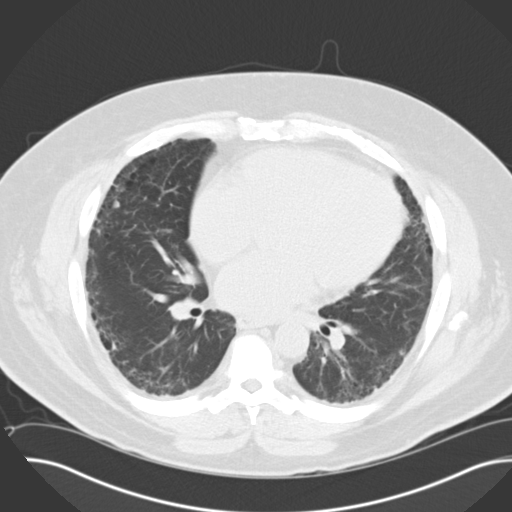

[Series 3: lung · axial · 0.78mm/px · z∈[-60,+200]mm · 8 of 68 slices shown]
[im 8/68  lung]
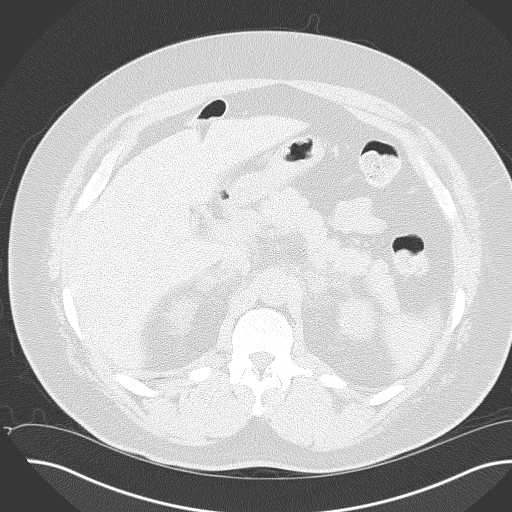
[im 15/68  lung]
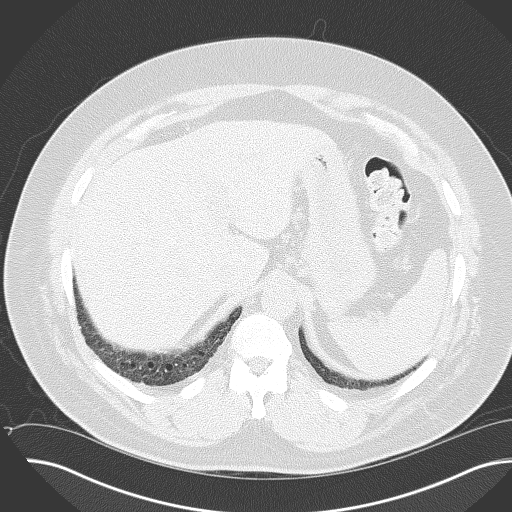
[im 23/68  lung]
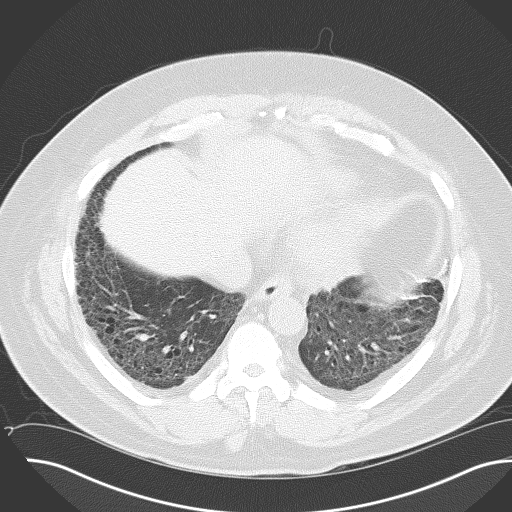
[im 30/68  lung]
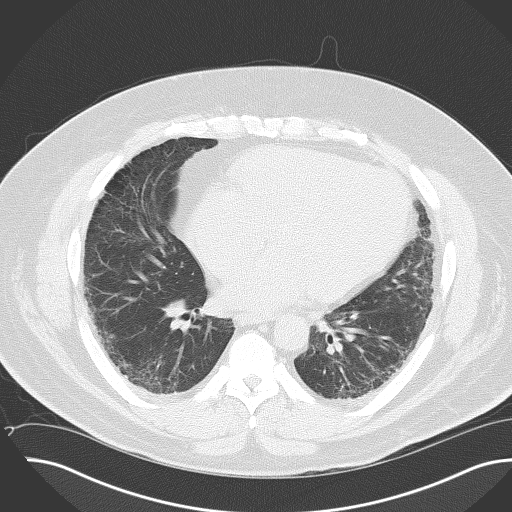
[im 38/68  lung]
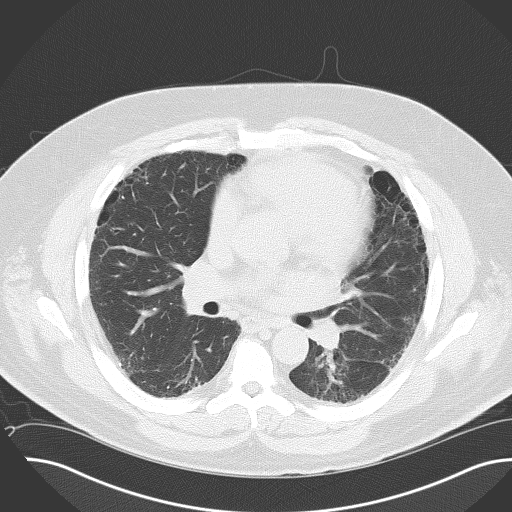
[im 45/68  lung]
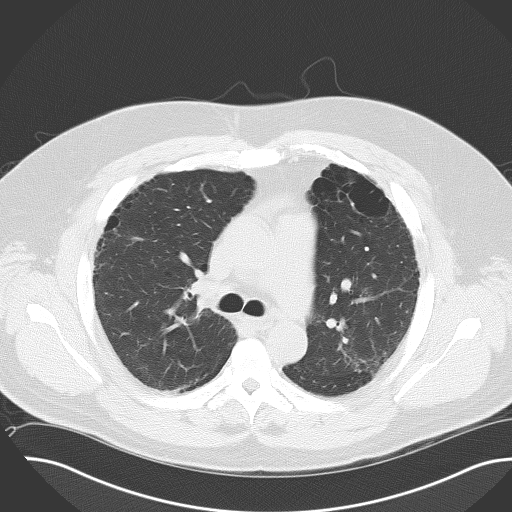
[im 53/68  lung]
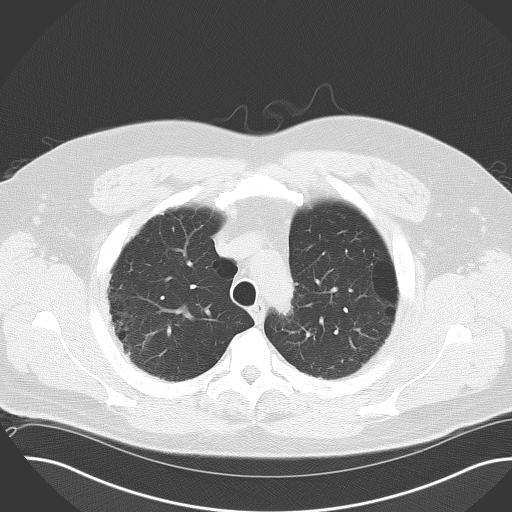
[im 60/68  lung]
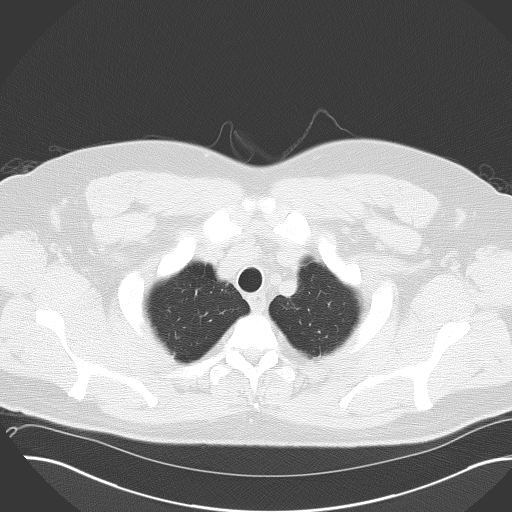

[Series 7: high res lungs · axial · 0.71mm/px · z∈[-14,+156]mm · 4 of 35 slices shown, 5 images]
[im 9/35  mediastinal]
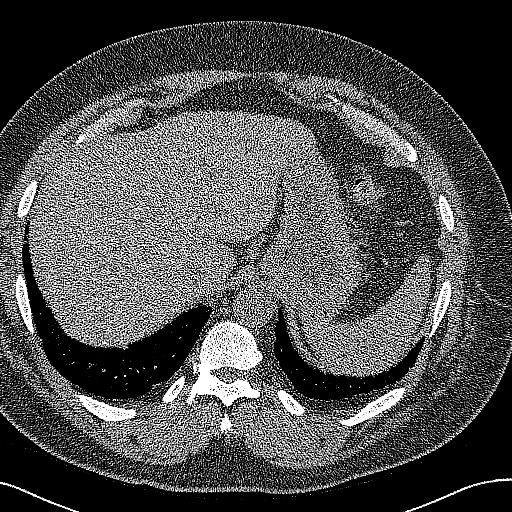
[im 9/35  lung]
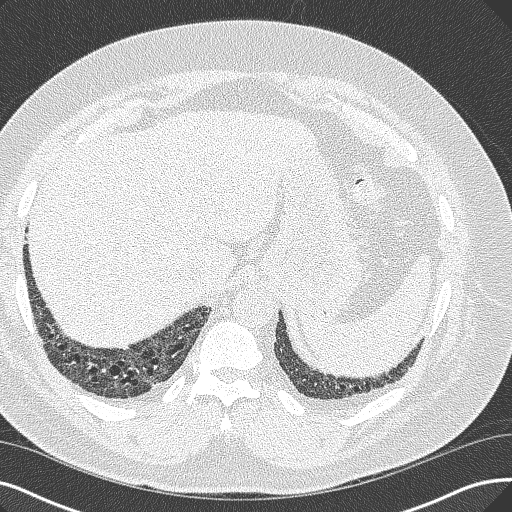
[im 17/35  lung]
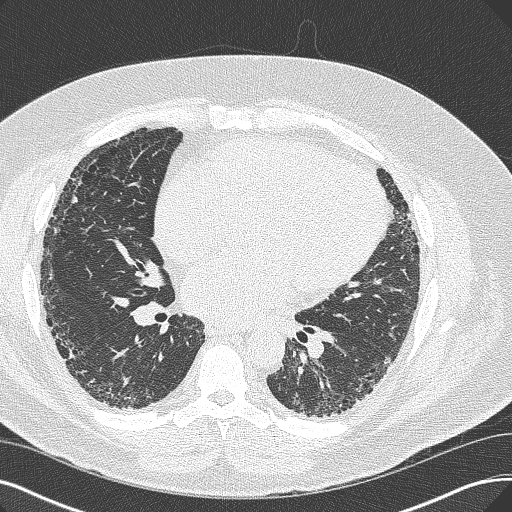
[im 18/35  lung]
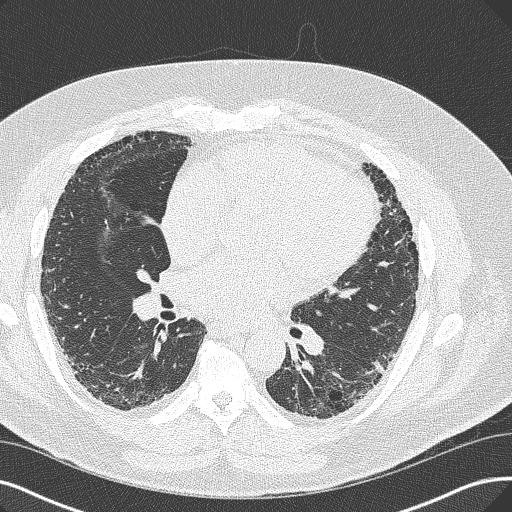
[im 26/35  lung]
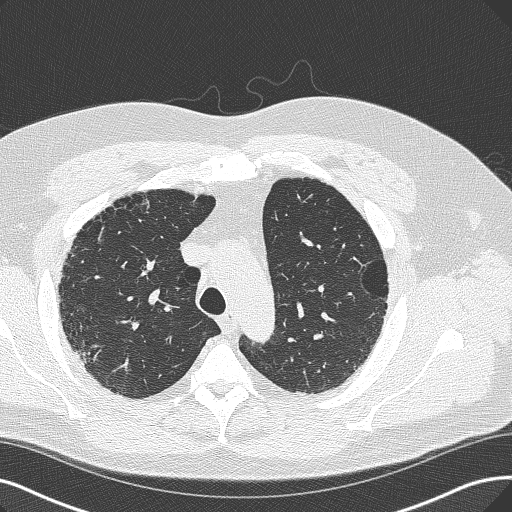

[16 of 33 positions shown; findings below may reference images not displayed]

Report from 12/20/2014 chest
radiograph (images not available). 02/15/2014 CT abdomen/pelvis.
FINDINGS: Mediastinum/Nodes: Top-normal heart size. Trace pericardial
fluid/thickening. Great vessels are normal in course and caliber.
Normal visualized thyroid. There is a small fluid level in the mid
thoracic esophagus. No axillary adenopathy . There is a mildly
enlarged 1.1 cm right paratracheal node (series 2/ image 21). No
additional pathologically enlarged mediastinal or gross hilar nodes.

Lungs/Pleura: No pneumothorax. No pleural effusion. Surgical sutures
from prior surgical lung biopsy are noted at the anterior left lung
base. Calcified subcentimeter granuloma in the peripheral left lower
lobe. There is mild to moderate paraseptal emphysema predominantly
in the upper lobes. There is a 5 mm solid pulmonary nodule in the
right middle lobe (series 3/ image 36), stable since 02/15/2014. No
acute consolidative airspace disease, additional significant
pulmonary nodules or lung masses. There is basilar predominant
peripheral reticulation and ground-glass attenuation throughout both
lungs, with associated lower lobe predominant traction
bronchiectasis. There are areas of relative sparing of the immediate
subpleural lung in the lower lobes. There are no regions of frank
honeycombing. There is no appreciable change in the findings at the
lung bases compared to the 02/15/2014 CT abdomen study. No
appreciable air trapping on the expiration sequence.

Upper abdomen: Simple 2.0 cm renal cyst in the lateral upper right
kidney. There is mild irregular thickening of both adrenal glands
without a discrete adrenal mass, most suggestive of adrenal
hyperplasia.

Musculoskeletal: No aggressive appearing focal osseous lesions. Mild
degenerative changes in the thoracic spine.
IMPRESSION: 1. Basilar predominant peripheral reticulation, ground-glass
attenuation and traction bronchiectasis in both lungs, with areas of
relative sparing of the immediate subpleural lung in the lower
lobes. No frank honeycombing. No appreciable change in the findings
at the lung bases compared to the 02/15/2014 CT abdomen study.
Findings favor fibrotic nonspecific interstitial pneumonia (NSIP),
although early usual interstitial pneumonia (UIP) cannot be entirely
excluded and a follow-up high-resolution chest CT in 6-12 months is
recommended to assess for temporal change. There is evidence of a
prior surgical lung biopsy at the left lung base and correlation
with the pathology results from this lung biopsy is recommended.
2. Mild to moderate paraseptal emphysema.
3. Right middle lobe 5 mm solid pulmonary nodule, for which 10 month
stability has been demonstrated. Recommend attention to this nodule
on the follow-up chest CT in 6-12 months.
4. Mild mediastinal lymphadenopathy, nonspecific, likely reactive.
5. Fluid level in the thoracic esophagus, suggesting esophageal
dysmotility and/or gastroesophageal reflux.

## 2017-07-25 ENCOUNTER — Ambulatory Visit (INDEPENDENT_AMBULATORY_CARE_PROVIDER_SITE_OTHER): Payer: Medicare Other | Admitting: Urology

## 2017-07-25 ENCOUNTER — Encounter: Payer: Self-pay | Admitting: Urology

## 2017-07-25 VITALS — BP 126/84 | HR 112 | Ht 74.0 in | Wt 280.0 lb

## 2017-07-25 DIAGNOSIS — R350 Frequency of micturition: Secondary | ICD-10-CM | POA: Diagnosis not present

## 2017-07-25 DIAGNOSIS — N529 Male erectile dysfunction, unspecified: Secondary | ICD-10-CM | POA: Diagnosis not present

## 2017-07-25 NOTE — Progress Notes (Signed)
07/25/2017 3:02 PM   Joe Mathews [birth year] [id number]  Referring provider: Barbette Reichmann, MD [address] [facility] [place], [place] [address]  Chief Complaint  Patient presents with  . Follow-up    HPI:  Pt referred back for urinary frequency and nocturia. A sleep study is pending to r/o OSA. He has CKD3 and scleroderma. Renal US June 2019 was benign and the bladder was not distended. The IPP reservoir was seen beside the bladder. A CT in 2016 showed a small prostate. Nocturia x 3-4 times. He has an adequate flow. He has no gross hematuria, dysuria or UTI. He drinks mainly water.   No neurogenic risk. His PSA was 1.0 Nov 2018.   He was seen in the past for left hydrocele. He has an IPP in place for ED.    PMH: Past Medical History:  Diagnosis Date  . Chronic kidney disease    stage 3  . Collagen vascular disease (HCC)   . Diabetes mellitus without complication (HCC)   . Dysphagia   . Dysrhythmia   . Esophagitis   . GERD (gastroesophageal reflux disease)   . Pulmonary fibrosis (HCC)   . Pulmonary hypertension (HCC)   . Raynaud disease   . Raynaud's disease   . Schatzki's ring   . Sclerodactyly   . Scleroderma [facility])     Surgical History: Past Surgical History:  Procedure Laterality Date  . COLONOSCOPY WITH PROPOFOL N/A 07/14/2015   Procedure: COLONOSCOPY WITH PROPOFOL;  Surgeon: Scot Jun, MD;  Location: [facility] ENDOSCOPY;  Service: Endoscopy;  Laterality: N/A;  . ESOPHAGOGASTRODUODENOSCOPY (EGD) WITH PROPOFOL N/A 04/28/2015   Procedure: ESOPHAGOGASTRODUODENOSCOPY (EGD) WITH PROPOFOL;  Surgeon: Elnita Maxwell, MD;  Location: [facility] ENDOSCOPY;  Service: Endoscopy;  Laterality: N/A;  . ESOPHAGOGASTRODUODENOSCOPY (EGD) WITH PROPOFOL N/A 07/14/2015   Procedure: ESOPHAGOGASTRODUODENOSCOPY (EGD) WITH PROPOFOL;  Surgeon: Scot Jun, MD;  Location: [facility] ENDOSCOPY;  Service: Endoscopy;  Laterality: N/A;  . ESOPHAGOGASTRODUODENOSCOPY  (EGD) WITH PROPOFOL N/A 01/29/2016   Procedure: ESOPHAGOGASTRODUODENOSCOPY (EGD) WITH PROPOFOL;  Surgeon: Scot Jun, MD;  Location: [facility] ENDOSCOPY;  Service: Endoscopy;  Laterality: N/A;  . LUNG BIOPSY    . RENAL BIOPSY    . SAVORY DILATION N/A 01/29/2016   Procedure: SAVORY DILATION;  Surgeon: Scot Jun, MD;  Location: [facility] ENDOSCOPY;  Service: Endoscopy;  Laterality: N/A;  . UPPER GI ENDOSCOPY      Home Medications:  Allergies as of 07/25/2017      Reactions   Protonix [pantoprazole Sodium]       Medication List        Accurate as of 07/25/17  3:02 PM. Always use your most recent med list.          ADCIRCA 20 MG tablet Generic drug:  tadalafil (PAH) Take 1 tablet by mouth 2 (two) times daily.   aspirin EC 81 MG tablet Take 81 mg by mouth daily.   cloNIDine 0.2 mg/24hr patch Commonly known as:  CATAPRES - Dosed in mg/24 hr Place 0.2 mg onto the skin once a week.   doxazosin 4 MG tablet Commonly known as:  CARDURA Take 4 mg by mouth daily.   furosemide 40 MG tablet Commonly known as:  LASIX Take 40 mg by mouth daily as needed.   glimepiride 4 MG tablet Commonly known as:  AMARYL Take 4 mg by mouth daily with breakfast.   linagliptin 5 MG Tabs tablet Commonly known as:  TRADJENTA Take 5 mg by mouth  daily.   meloxicam 7.5 MG tablet Commonly known as:  MOBIC Take 7.5 mg by mouth daily.   metFORMIN 1000 MG tablet Commonly known as:  GLUCOPHAGE Take 1,000 mg by mouth 2 (two) times daily with a meal.   mycophenolate 500 MG tablet Commonly known as:  CELLCEPT Take 1,500 mg by mouth 2 (two) times daily.   NIFEdipine 90 MG 24 hr tablet Commonly known as:  ADALAT CC Take 90 mg by mouth daily.   OPSUMIT 10 MG tablet Generic drug:  macitentan Take 10 mg by mouth daily.   OXYGEN Inhale 2 L/min into the lungs continuous.   potassium chloride 10 MEQ CR capsule Commonly known as:  MICRO-K Take by mouth.   ranitidine 300 MG tablet Commonly known  as:  ZANTAC Take 300 mg by mouth daily before breakfast.   valsartan 320 MG tablet Commonly known as:  DIOVAN Take 320 mg by mouth daily.   VICTOZA 18 MG/3ML Sopn Generic drug:  liraglutide Inject 1.8 mg into the skin daily.       Allergies:  Allergies  Allergen Reactions  . Protonix [Pantoprazole Sodium]     Family History: Family History  Problem Relation Age of Onset  . Cancer Mother   . Diabetes Mellitus II Mother   . Diabetes Mellitus II Father     Social History:  reports that he has been smoking cigarettes.  He has been smoking about 0.25 packs per day. He quit smokeless tobacco use about 32 years ago. He reports that he does not drink alcohol or use drugs.  ROS: UROLOGY Frequent Urination?: Yes Hard to postpone urination?: No Burning/pain with urination?: No Get up at night to urinate?: Yes Leakage of urine?: No Urine stream starts and stops?: No Trouble starting stream?: No Do you have to strain to urinate?: No Blood in urine?: No Urinary tract infection?: No Sexually transmitted disease?: No Injury to kidneys or bladder?: No Painful intercourse?: No Weak stream?: No Erection problems?: No Penile pain?: No  Gastrointestinal Nausea?: No Vomiting?: No Indigestion/heartburn?: Yes Diarrhea?: No Constipation?: No  Constitutional Fever: No Night sweats?: No Weight loss?: No Fatigue?: No  Skin Skin rash/lesions?: No Itching?: No  Eyes Blurred vision?: No Double vision?: No  Ears/Nose/Throat Sore throat?: No Sinus problems?: No  Hematologic/Lymphatic Swollen glands?: No Easy bruising?: No  Cardiovascular Leg swelling?: No Chest pain?: No  Respiratory Cough?: No Shortness of breath?: Yes  Endocrine Excessive thirst?: No  Musculoskeletal Back pain?: No Joint pain?: No  Neurological Headaches?: No Dizziness?: No  Psychologic Depression?: No Anxiety?: No  Physical Exam: BP 126/84   Pulse (!) 112   Ht 6\' 2"  (1.88 m)    Wt 127 kg (280 lb)   BMI 35.95 kg/m   Constitutional:  Alert and oriented, No acute distress. HEENT: [place] AT, moist mucus membranes.  Trachea midline, no masses. Cardiovascular: No clubbing, cyanosis, or edema. Respiratory: Normal respiratory effort, no increased work of breathing. GI: Abdomen is soft, nontender, nondistended, no abdominal masses GU: No CVA tenderness; IPP normal, left hydrocele, testicles normal. Scrotum normal. DRE -- prostate 20 grams and normal.  Lymph: No cervical or inguinal lymphadenopathy. Skin: No rashes, bruises or suspicious lesions. Neurologic: Grossly intact, no focal deficits, moving all 4 extremities. Psychiatric: Normal mood and affect.  Laboratory Data: Lab Results  Component Value Date   WBC 8.6 06/17/2015   HGB 11.4 (L) 06/17/2015   HCT 36.3 (L) 06/17/2015   MCV 83.1 06/17/2015   PLT 250 06/17/2015  Lab Results  Component Value Date   CREATININE 1.55 (H) 05/23/2016    No results found for: PSA  No results found for: TESTOSTERONE  Lab Results  Component Value Date   HGBA1C 7.2 (H) 07/16/2013    Urinalysis    Component Value Date/Time   APPEARANCEUR Clear 11/22/2016 1011   GLUCOSEU Negative 11/22/2016 1011   BILIRUBINUR Negative 11/22/2016 1011   PROTEINUR 3+ (A) 11/22/2016 1011   NITRITE Negative 11/22/2016 1011   LEUKOCYTESUR Negative 11/22/2016 1011    Lab Results  Component Value Date   LABMICR See below: 11/22/2016   WBCUA None seen 11/22/2016   RBCUA 0-2 11/22/2016   LABEPIT None seen 11/22/2016   MUCUS Present (A) 11/22/2016   BACTERIA Few (A) 11/22/2016    Pertinent Imaging: Renal US and CT -- images reviewed  No results found for this or any previous visit. No results found for this or any previous visit. No results found for this or any previous visit. No results found for this or any previous visit. Results for orders placed during the hospital encounter of 07/03/17  US RENAL   Narrative CLINICAL DATA:   Chronic renal disease.  EXAM: RENAL / URINARY TRACT ULTRASOUND COMPLETE  COMPARISON:  CT 02/15/2014.  FINDINGS: Right Kidney:  Length: 13.0 cm. Echogenicity within normal limits. No hydronephrosis visualized. Simple cysts right kidney with the largest measuring 4.4 cm.  Left Kidney:  Length: 12.6 cm. Echogenicity within normal limits. No hydronephrosis visualized. Simple cyst left kidney with the largest measuring 4.1 cm.  Bladder:  Bladder is nondistended. What appears to represent a bladder septations most likely adjacent penile prosthesis reservoir.  IMPRESSION: 1. Simple cysts both kidneys again noted. Similar findings noted on prior CT of 02/15/2014.  2.  No acute abnormality.  No hydronephrosis.   Electronically Signed   By: Maisie Fus  Register   On: 07/04/2017 08:46    No results found for this or any previous visit. No results found for this or any previous visit. No results found for this or any previous visit.  Assessment & Plan:    1. Erectile dysfunction, unspecified erectile dysfunction type S/p IPP.  - Urinalysis, Complete  2. Frequency, urgency --  Discussed the nature r/b of surveillance, PT, meds. He just wanted reassurance everything looked OK and doesn't want to start meds. He not bothered.   No follow-ups on file.  Jerilee Field, MD  [facility] [address] [place], [place] [address] [phone]

## 2017-12-26 ENCOUNTER — Encounter: Payer: Self-pay | Admitting: *Deleted

## 2017-12-29 ENCOUNTER — Ambulatory Visit
Admission: RE | Admit: 2017-12-29 | Discharge: 2017-12-29 | Disposition: A | Discharge status: Home / Self Care | Payer: Medicare Other | Attending: Unknown Physician Specialty | Admitting: Unknown Physician Specialty

## 2017-12-29 ENCOUNTER — Ambulatory Visit: Payer: Medicare Other | Admitting: Anesthesiology

## 2017-12-29 ENCOUNTER — Encounter: Payer: Self-pay | Admitting: *Deleted

## 2017-12-29 ENCOUNTER — Encounter
Admission: RE | Disposition: A | Discharge status: Home / Self Care | Payer: Self-pay | Source: Home / Self Care | Attending: Unknown Physician Specialty

## 2017-12-29 DIAGNOSIS — K222 Esophageal obstruction: Secondary | ICD-10-CM | POA: Diagnosis present

## 2017-12-29 DIAGNOSIS — K210 Gastro-esophageal reflux disease with esophagitis: Secondary | ICD-10-CM | POA: Insufficient documentation

## 2017-12-29 DIAGNOSIS — Z791 Long term (current) use of non-steroidal anti-inflammatories (NSAID): Secondary | ICD-10-CM | POA: Diagnosis not present

## 2017-12-29 DIAGNOSIS — K449 Diaphragmatic hernia without obstruction or gangrene: Secondary | ICD-10-CM | POA: Diagnosis not present

## 2017-12-29 DIAGNOSIS — I7300 Raynaud's syndrome without gangrene: Secondary | ICD-10-CM | POA: Insufficient documentation

## 2017-12-29 DIAGNOSIS — J8410 Pulmonary fibrosis, unspecified: Secondary | ICD-10-CM | POA: Diagnosis not present

## 2017-12-29 DIAGNOSIS — Z7982 Long term (current) use of aspirin: Secondary | ICD-10-CM | POA: Insufficient documentation

## 2017-12-29 DIAGNOSIS — Z7984 Long term (current) use of oral hypoglycemic drugs: Secondary | ICD-10-CM | POA: Insufficient documentation

## 2017-12-29 DIAGNOSIS — Z79899 Other long term (current) drug therapy: Secondary | ICD-10-CM | POA: Diagnosis not present

## 2017-12-29 DIAGNOSIS — I129 Hypertensive chronic kidney disease with stage 1 through stage 4 chronic kidney disease, or unspecified chronic kidney disease: Secondary | ICD-10-CM | POA: Diagnosis not present

## 2017-12-29 DIAGNOSIS — E1122 Type 2 diabetes mellitus with diabetic chronic kidney disease: Secondary | ICD-10-CM | POA: Diagnosis not present

## 2017-12-29 DIAGNOSIS — N189 Chronic kidney disease, unspecified: Secondary | ICD-10-CM | POA: Diagnosis not present

## 2017-12-29 DIAGNOSIS — K3189 Other diseases of stomach and duodenum: Secondary | ICD-10-CM | POA: Diagnosis not present

## 2017-12-29 DIAGNOSIS — I272 Pulmonary hypertension, unspecified: Secondary | ICD-10-CM | POA: Diagnosis not present

## 2017-12-29 DIAGNOSIS — F17210 Nicotine dependence, cigarettes, uncomplicated: Secondary | ICD-10-CM | POA: Insufficient documentation

## 2017-12-29 HISTORY — DX: Essential (primary) hypertension: I10

## 2017-12-29 HISTORY — PX: ESOPHAGOGASTRODUODENOSCOPY (EGD) WITH PROPOFOL: SHX5813

## 2017-12-29 HISTORY — DX: Other specified interstitial pulmonary diseases: J84.89

## 2017-12-29 LAB — GLUCOSE, CAPILLARY: Glucose-Capillary: 121 mg/dL — ABNORMAL HIGH (ref 70–99)

## 2017-12-29 SURGERY — ESOPHAGOGASTRODUODENOSCOPY (EGD) WITH PROPOFOL
Anesthesia: General

## 2017-12-29 MED ORDER — BUTAMBEN-TETRACAINE-BENZOCAINE 2-2-14 % EX AERO
INHALATION_SPRAY | CUTANEOUS | Status: AC
  Filled 2017-12-29: qty 5

## 2017-12-29 MED ORDER — SODIUM CHLORIDE 0.9 % IV SOLN
INTRAVENOUS | Status: DC
  Administered 2017-12-29: 08:00:00 via INTRAVENOUS

## 2017-12-29 MED ORDER — MIDAZOLAM HCL 2 MG/2ML IJ SOLN
INTRAMUSCULAR | Status: AC
  Filled 2017-12-29: qty 2

## 2017-12-29 MED ORDER — FENTANYL CITRATE (PF) 100 MCG/2ML IJ SOLN
INTRAMUSCULAR | Status: AC
  Filled 2017-12-29: qty 2

## 2017-12-29 MED ORDER — PROPOFOL 500 MG/50ML IV EMUL
INTRAVENOUS | Status: DC | PRN
  Administered 2017-12-29: 120 ug/kg/min via INTRAVENOUS

## 2017-12-29 MED ORDER — LIDOCAINE HCL (PF) 2 % IJ SOLN
INTRAMUSCULAR | Status: AC
  Filled 2017-12-29: qty 10

## 2017-12-29 MED ORDER — FENTANYL CITRATE (PF) 100 MCG/2ML IJ SOLN
INTRAMUSCULAR | Status: DC | PRN
  Administered 2017-12-29 (×2): 50 ug via INTRAVENOUS

## 2017-12-29 MED ORDER — MIDAZOLAM HCL 2 MG/2ML IJ SOLN
INTRAMUSCULAR | Status: DC | PRN
  Administered 2017-12-29: 2 mg via INTRAVENOUS

## 2017-12-29 MED ORDER — PROPOFOL 500 MG/50ML IV EMUL
INTRAVENOUS | Status: AC
  Filled 2017-12-29: qty 50

## 2017-12-29 MED ORDER — LIDOCAINE HCL (CARDIAC) PF 100 MG/5ML IV SOSY
PREFILLED_SYRINGE | INTRAVENOUS | Status: DC | PRN
  Administered 2017-12-29: 30 mg via INTRAVENOUS

## 2017-12-29 NOTE — H&P (Signed)
Primary Care Physician:  Barbette ReichmannHande, Vishwanath, MD Primary Gastroenterologist:  Dr. Mechele Collin  Pre-Procedure History & Physical: HPI:  Joe NeedyClarence M Mathews is a 52 y.o. male is here for an endoscopy.   Past Medical History:  Diagnosis Date  . Chronic kidney disease    stage 3  . Collagen vascular disease (HCC)   . Diabetes mellitus without complication (HCC)   . Dysphagia   . Dysrhythmia   . Esophagitis   . GERD (gastroesophageal reflux disease)   . Hypertension   . NSIP (nonspecific interstitial pneumonia) (HCC)   . Pulmonary fibrosis (HCC)   . Pulmonary hypertension (HCC)   . Raynaud disease   . Raynaud's disease   . Schatzki's ring   . Sclerodactyly   . Scleroderma [facility](HCC)     Past Surgical History:  Procedure Laterality Date  . COLONOSCOPY WITH PROPOFOL N/A 07/14/2015   Procedure: COLONOSCOPY WITH PROPOFOL;  Surgeon: Scot Junobert T , MD;  [facility];  Service: Endoscopy;  Laterality: N/A;  . ESOPHAGOGASTRODUODENOSCOPY (EGD) WITH PROPOFOL N/A 04/28/2015   Procedure: ESOPHAGOGASTRODUODENOSCOPY (EGD) WITH PROPOFOL;  Surgeon: Elnita MaxwellMatthew Gordon Rein, MD;  [facility];  Service: Endoscopy;  Laterality: N/A;  . ESOPHAGOGASTRODUODENOSCOPY (EGD) WITH PROPOFOL N/A 07/14/2015   Procedure: ESOPHAGOGASTRODUODENOSCOPY (EGD) WITH PROPOFOL;  Surgeon: Scot Junobert T , MD;  [facility];  Service: Endoscopy;  Laterality: N/A;  . ESOPHAGOGASTRODUODENOSCOPY (EGD) WITH PROPOFOL N/A 01/29/2016   Procedure: ESOPHAGOGASTRODUODENOSCOPY (EGD) WITH PROPOFOL;  Surgeon: Scot Junobert T , MD;  [facility];  Service: Endoscopy;  Laterality: N/A;  . LUNG BIOPSY    . RENAL BIOPSY    . SAVORY DILATION N/A 01/29/2016   Procedure: SAVORY DILATION;  Surgeon: Scot Junobert T , MD;  [facility];  Service: Endoscopy;  Laterality: N/A;  . UPPER GI ENDOSCOPY      Prior to Admission medications   Medication Sig Start Date End Date Taking? Authorizing Provider   aspirin EC 81 MG tablet Take 81 mg by mouth daily.   Yes [provider]  cloNIDine (CATAPRES - DOSED IN MG/24 HR) 0.2 mg/24hr patch Place 0.2 mg onto the skin once a week.   Yes [provider]  doxazosin (CARDURA) 4 MG tablet Take 4 mg by mouth daily.   Yes [provider]  losartan (COZAAR) 100 MG tablet Take 100 mg by mouth daily.   Yes [provider]  NIFEdipine (ADALAT CC) 90 MG 24 hr tablet Take 90 mg by mouth daily.   Yes [provider]  TADALAFIL PO Take by mouth daily.   Yes [provider]  ADCIRCA 20 MG TABS Take 1 tablet by mouth 2 (two) times daily. 06/12/15   [provider]  famotidine (PEPCID) 40 MG tablet Take 40 mg by mouth daily.    [provider]  furosemide (LASIX) 40 MG tablet Take 40 mg by mouth daily as needed.     [provider]  glimepiride (AMARYL) 4 MG tablet Take 4 mg by mouth daily with breakfast.    [provider]  linagliptin (TRADJENTA) 5 MG TABS tablet Take 5 mg by mouth daily.    [provider]  Liraglutide (VICTOZA) 18 MG/3ML SOPN Inject 1.8 mg into the skin daily.    [provider]  Macitentan (OPSUMIT) 10 MG TABS Take 10 mg by mouth daily.    [provider]  meloxicam (MOBIC) 7.5 MG tablet Take 7.5 mg by mouth daily.    [provider]  metFORMIN (GLUCOPHAGE)  1000 MG tablet Take 1,000 mg by mouth 2 (two) times daily with a meal.    [provider]  mycophenolate (CELLCEPT) 500 MG tablet Take 1,500 mg by mouth 2 (two) times daily.    [provider]  OXYGEN Inhale 2 L/min into the lungs continuous.    [provider]  potassium chloride (MICRO-K) 10 MEQ CR capsule Take by mouth. 06/30/17   [provider]  ranitidine (ZANTAC) 300 MG tablet Take 300 mg by mouth daily before breakfast.    [provider]  valsartan (DIOVAN) 320 MG tablet Take 320 mg by mouth daily.    [provider]    Allergies as of 11/14/2017 - Review Complete 07/25/2017  Allergen Reaction Noted  . Protonix [pantoprazole sodium]  04/27/2015    Family History  Problem Relation Age of Onset  . Cancer Mother   . Diabetes Mellitus II Mother   . Diabetes Mellitus II Father     Social History   Socioeconomic History  . Marital status: Married    Spouse name: Not on file  . Number of children: Not on file  . Years of education: Not on file  . Highest education level: Not on file  Occupational History  . Not on file  Social Needs  . Financial resource strain: Not on file  . Food insecurity:    Worry: Not on file    Inability: Not on file  . Transportation needs:    Medical: Not on file    Non-medical: Not on file  Tobacco Use  . Smoking status: Current Some Day Smoker    Packs/day: 0.25    Years: 28.00    Pack years: 7.00    Types: Cigarettes    Last attempt to quit: 12/19/2015    Years since quitting: 2.0  . Smokeless tobacco: Former Neurosurgeon    Quit date: 07/13/1985  Substance and Sexual Activity  . Alcohol use: Yes    Comment: 2-3 drinks   . Drug use: No  . Sexual activity: Not on file  Lifestyle  . Physical activity:    Days per week: Not on file    Minutes per session: Not on file  . Stress: Not on file  Relationships  . Social connections:    Talks on phone: Not on file    Gets together: Not on file    Attends religious service: Not on file    Active member of club or organization: Not on file    Attends meetings of clubs or organizations: Not on file    Relationship status: Not on file  . Intimate partner violence:    Fear of current or ex partner: Not on file    Emotionally abused: Not on file    Physically abused: Not on file    Forced sexual activity: Not on file  Other Topics Concern  . Not on file  Social History Narrative  . Not on file    Review of Systems: See HPI, otherwise negative ROS  Physical Exam: BP (!) 147/83   Pulse 68    Temp (!) 97.3 F (36.3 C) (Tympanic)   Resp 16   Ht 6\' 2"  (1.88 m)   Wt 128.4 kg   SpO2 99%   BMI 36.34 kg/m  General:   Alert,  pleasant and cooperative in NAD Head:  Normocephalic and atraumatic. Neck:  Supple; no masses or thyromegaly. Lungs:  Clear throughout to auscultation.    Heart:  Regular rate  and rhythm. Abdomen:  Soft, nontender and nondistended. Normal bowel sounds, without guarding, and without rebound.   Neurologic:  Alert and  oriented x4;  grossly normal neurologically.  Impression/Plan: Joe NeedyClarence M Mathews is here for an endoscopy to be performed for Dysphagia.  Last one was 01/29/2016.  Risks, benefits, limitations, and alternatives regarding  endoscopy have been reviewed with the patient.  Questions have been answered.  All parties agreeable.   Lynnae PrudeELLIOTT, , MD  12/29/2017, 8:05 AM

## 2017-12-29 NOTE — Anesthesia Preprocedure Evaluation (Signed)
Anesthesia Evaluation  Patient identified by MRN, date of birth, ID band Patient awake    Reviewed: Allergy & Precautions, NPO status , Patient's Chart, lab work & pertinent test results, reviewed documented beta blocker date and time   Airway Mallampati: II  TM Distance: >3 FB     Dental  (+) Chipped   Pulmonary pneumonia, resolved, Current Smoker,           Cardiovascular hypertension, Pt. on medications + dysrhythmias      Neuro/Psych    GI/Hepatic GERD  ,  Endo/Other  diabetes, Type 2  Renal/GU Renal disease     Musculoskeletal   Abdominal   Peds  Hematology   Anesthesia Other Findings   Reproductive/Obstetrics                             Anesthesia Physical Anesthesia Plan  ASA: III  Anesthesia Plan: General   Post-op Pain Management:    Induction: Intravenous  PONV Risk Score and Plan:   Airway Management Planned:   Additional Equipment:   Intra-op Plan:   Post-operative Plan:   Informed Consent: I have reviewed the patients History and Physical, chart, labs and discussed the procedure including the risks, benefits and alternatives for the proposed anesthesia with the patient or authorized representative who has indicated his/her understanding and acceptance.     Plan Discussed with: CRNA  Anesthesia Plan Comments:         Anesthesia Quick Evaluation

## 2017-12-29 NOTE — Transfer of Care (Signed)
Immediate Anesthesia Transfer of Care Note  Patient: Joe Mathews  Procedure(s) Performed: ESOPHAGOGASTRODUODENOSCOPY (EGD) WITH PROPOFOL (N/A )  Patient Location: PACU  Anesthesia Type:General  Level of Consciousness: awake, alert  and oriented  Airway & Oxygen Therapy: Patient Spontanous Breathing and Patient connected to face mask oxygen  Post-op Assessment: patient awake, spont resps  Post vital signs: Reviewed and stable  Last Vitals:  Vitals Value Taken Time  BP 100/56 12/29/2017  8:43 AM  Temp 35.8 C 12/29/2017  8:42 AM  Pulse 74 12/29/2017  8:43 AM  Resp 20 12/29/2017  8:43 AM  SpO2 100 % 12/29/2017  8:43 AM  Vitals shown include unvalidated device data.  Last Pain:  Vitals:   12/29/17 0842  TempSrc: Tympanic  PainSc: 0-No pain         Complications: No apparent anesthesia complications

## 2017-12-29 NOTE — Anesthesia Procedure Notes (Signed)
Performed by: Tonia Ghentook-Martin, Sanai Frick Pre-anesthesia Checklist: Patient identified, Emergency Drugs available, Suction available, Patient being monitored and Timeout performed Patient Re-evaluated:Patient Re-evaluated prior to induction Oxygen Delivery Method: Simple face mask Preoxygenation: Pre-oxygenation with 100% oxygen Induction Type: IV induction Ventilation: Nasal airway inserted- appropriate to patient size Airway Equipment and Method: Bite block Placement Confirmation: positive ETCO2 and CO2 detector Difficulty Due To: Difficult Airway- due to large tongue

## 2017-12-29 NOTE — Anesthesia Post-op Follow-up Note (Signed)
Anesthesia QCDR form completed.        

## 2017-12-29 NOTE — Anesthesia Postprocedure Evaluation (Signed)
Anesthesia Post Note  Patient: Joe NeedyClarence M Mathews  Procedure(s) Performed: ESOPHAGOGASTRODUODENOSCOPY (EGD) WITH PROPOFOL (N/A )  Patient location during evaluation: Endoscopy Anesthesia Type: General Level of consciousness: awake and alert Pain management: pain level controlled Vital Signs Assessment: post-procedure vital signs reviewed and stable Respiratory status: spontaneous breathing, nonlabored ventilation, respiratory function stable and patient connected to nasal cannula oxygen Cardiovascular status: blood pressure returned to baseline and stable Postop Assessment: no apparent nausea or vomiting Anesthetic complications: no     Last Vitals:  Vitals:   12/29/17 0902 12/29/17 0912  BP: 122/71 122/71  Pulse: 70 72  Resp: 14 14  Temp:    SpO2: 98% 96%    Last Pain:  Vitals:   12/29/17 0912  TempSrc:   PainSc: 0-No pain                 Jarelis Ehlert S

## 2017-12-29 NOTE — Op Note (Signed)
[facility] Gastroenterology Patient Name: Joe Mathews Procedure Date: 12/29/2017 8:12 AM MRN: [id number] Account #: [id number] Date of Birth: 09/29/1965 Admit Type: Outpatient Age: [age] [name] Gender: Male Note Status: Finalized Procedure:            Upper GI endoscopy Indications:          Dysphagia Providers:            Scot Junobert T. Beda Dula, MD Referring MD:         Barbette ReichmannVishwanath Hande, MD (Referring MD) Medicines:            Propofol per Anesthesia Complications:        Minor bleeding - stopped spontaneously Procedure:            Pre-Anesthesia Assessment:                       - After reviewing the risks and benefits, the patient                        was deemed in satisfactory condition to undergo the                        procedure.                       After obtaining informed consent, the endoscope was                        passed under direct vision. Throughout the procedure,                        the patient's blood pressure, pulse, and oxygen                        saturations were monitored continuously. The Endoscope                        was introduced through the mouth, and advanced to the                        second part of duodenum. The upper GI endoscopy was                        somewhat difficult due to narrowing. The patient                        tolerated the procedure. Findings:      LA Grade A (one or more mucosal breaks less than 5 mm, not extending       between tops of 2 mucosal folds) esophagitis with no bleeding was found       35 cm from the incisors.      A mild Schatzki ring was found at the gastroesophageal junction.      A small hiatal hernia was present.      Diffuse mildly erythematous mucosa without active bleeding and with no       stigmata of bleeding was found in the duodenal bulb. Biopsies were taken       with a cold forceps for histology. Impression:           - LA Grade A reflux esophagitis.                       -  Mild Schatzki ring.                       - Small hiatal hernia.                       - Erythematous duodenopathy. Biopsied. Recommendation:       - The findings and recommendations were discussed with                        the patient's family. Scot Junobert T Jill Ruppe, MD 12/29/2017 8:33:41 AM This report has been signed electronically. Number of Addenda: 0 Note Initiated On: 12/29/2017 8:12 AM      [facility]

## 2018-01-02 ENCOUNTER — Encounter: Payer: Self-pay | Admitting: Unknown Physician Specialty

## 2018-11-19 NOTE — Progress Notes (Signed)
[facility] Note  11/24/2018     CHIEF COMPLAINT Patient presents for Diabetic Eye Exam   HISTORY OF PRESENT ILLNESS: Joe Mathews is a 53 y.o. male who presents to the clinic today for:   HPI    Diabetic Eye Exam    Vision is stable.  Associated Symptoms Negative for Flashes, Distortion, Pain, Photophobia, Trauma, Jaw Claudication, Fever, Fatigue, Floaters, Blind Spot, Redness, Glare, Scalp Tenderness, Shoulder/Hip pain and Weight Loss.  Diabetes characteristics include Type 2.  This started 20 years ago.  Blood sugar level fluctuates.  Last Blood Glucose 199 (From meter, continuous monitoring).  Last A1C 6.7 (1 month ago).  Associated Diagnosis Kidney Disease (History of--kidney function improving).  I, the attending physician,  performed the HPI with the patient and updated documentation appropriately.          Comments    Patient referred for diabetic eye exam. Patient diabetic for about 20 years. Patient denies any problems with vision. Recently lost 10 lbs in an attempt to improve blood sugars. Quit smoking about 3 months ago.        Last edited by Bernarda Caffey, MD on 11/24/2018 10:11 AM. (History)    pt was sent here by his PCP, Dr. Ginette Pitman, for a DM exam, pt denies any problems with his vision, pt takes oral medication, he states his last A1c was 6.7, he states it has come down since he lost 10lbs, pt also takes medication for blood pressure  Referring physician: Tracie Harrier, MD [address] [place],  [place] [address]  HISTORICAL INFORMATION:   Selected notes from the [facility] Referred by PCP for DEE.  Pt also has stage 3 kidney disease.  A1C 7.0.   CURRENT MEDICATIONS: No current outpatient medications on file. (Ophthalmic Drugs)   No current facility-administered medications for this visit.  (Ophthalmic Drugs)   Current Outpatient Medications (Other)  Medication Sig  . ADCIRCA 20 MG  TABS Take 1 tablet by mouth 2 (two) times daily.  Marland Kitchen aspirin EC 81 MG tablet Take 81 mg by mouth daily.  . cloNIDine (CATAPRES - DOSED IN MG/24 HR) 0.2 mg/24hr patch Place 0.2 mg onto the skin once a week.  . doxazosin (CARDURA) 4 MG tablet Take 4 mg by mouth daily.  . famotidine (PEPCID) 40 MG tablet Take 40 mg by mouth daily.  . furosemide (LASIX) 40 MG tablet Take 40 mg by mouth daily as needed.   Marland Kitchen glimepiride (AMARYL) 4 MG tablet Take 4 mg by mouth daily with breakfast.  . linagliptin (TRADJENTA) 5 MG TABS tablet Take 5 mg by mouth daily.  . Liraglutide (VICTOZA) 18 MG/3ML SOPN Inject 1.8 mg into the skin daily.  Marland Kitchen losartan (COZAAR) 100 MG tablet Take 100 mg by mouth daily.  . Macitentan (OPSUMIT) 10 MG TABS Take 10 mg by mouth daily.  . meloxicam (MOBIC) 7.5 MG tablet Take 7.5 mg by mouth daily.  . metFORMIN (GLUCOPHAGE) 1000 MG tablet Take 1,000 mg by mouth 2 (two) times daily with a meal.  . mycophenolate (CELLCEPT) 500 MG tablet Take 1,500 mg by mouth 2 (two) times daily.  Marland Kitchen NIFEdipine (ADALAT CC) 90 MG 24 hr tablet Take 90 mg by mouth daily.  . OXYGEN Inhale 2 L/min into the lungs continuous.  . potassium chloride (MICRO-K) 10 MEQ CR capsule Take by mouth.  . ranitidine (ZANTAC) 300 MG tablet Take 300 mg by mouth daily before breakfast.  .  TADALAFIL PO Take by mouth daily.  . valsartan (DIOVAN) 320 MG tablet Take 320 mg by mouth daily.   No current facility-administered medications for this visit.  (Other)      REVIEW OF SYSTEMS: ROS    Positive for: Genitourinary, Endocrine, Eyes   Negative for: Constitutional, Skin, HENT, Allergic/Imm   Last edited by Annalee Genta D, COT on 11/24/2018  9:35 AM. (History)       ALLERGIES Allergies  Allergen Reactions  . Omeprazole   . Protonix [Pantoprazole Sodium]     PAST MEDICAL HISTORY Past Medical History:  Diagnosis Date  . Chronic kidney disease    stage 3  . Collagen vascular disease (HCC)   . Diabetes mellitus  without complication (HCC)   . Dysphagia   . Dysrhythmia   . Esophagitis   . GERD (gastroesophageal reflux disease)   . Hypertension   . NSIP (nonspecific interstitial pneumonia) (HCC)   . Pulmonary fibrosis (HCC)   . Pulmonary hypertension (HCC)   . Raynaud disease   . Raynaud's disease   . Schatzki's ring   . Sclerodactyly   . Scleroderma [facility])    Past Surgical History:  Procedure Laterality Date  . COLONOSCOPY WITH PROPOFOL N/A 07/14/2015   Procedure: COLONOSCOPY WITH PROPOFOL;  Surgeon: Scot Jun, MD;  Location: [facility] ENDOSCOPY;  Service: Endoscopy;  Laterality: N/A;  . ESOPHAGOGASTRODUODENOSCOPY (EGD) WITH PROPOFOL N/A 04/28/2015   Procedure: ESOPHAGOGASTRODUODENOSCOPY (EGD) WITH PROPOFOL;  Surgeon: Elnita Maxwell, MD;  Location: [facility] ENDOSCOPY;  Service: Endoscopy;  Laterality: N/A;  . ESOPHAGOGASTRODUODENOSCOPY (EGD) WITH PROPOFOL N/A 07/14/2015   Procedure: ESOPHAGOGASTRODUODENOSCOPY (EGD) WITH PROPOFOL;  Surgeon: Scot Jun, MD;  Location: [facility] ENDOSCOPY;  Service: Endoscopy;  Laterality: N/A;  . ESOPHAGOGASTRODUODENOSCOPY (EGD) WITH PROPOFOL N/A 01/29/2016   Procedure: ESOPHAGOGASTRODUODENOSCOPY (EGD) WITH PROPOFOL;  Surgeon: Scot Jun, MD;  Location: [facility] ENDOSCOPY;  Service: Endoscopy;  Laterality: N/A;  . ESOPHAGOGASTRODUODENOSCOPY (EGD) WITH PROPOFOL N/A 12/29/2017   Procedure: ESOPHAGOGASTRODUODENOSCOPY (EGD) WITH PROPOFOL;  Surgeon: Scot Jun, MD;  Location: [facility] ENDOSCOPY;  Service: Endoscopy;  Laterality: N/A;  . LUNG BIOPSY    . RENAL BIOPSY    . SAVORY DILATION N/A 01/29/2016   Procedure: SAVORY DILATION;  Surgeon: Scot Jun, MD;  Location: [facility] ENDOSCOPY;  Service: Endoscopy;  Laterality: N/A;  . UPPER GI ENDOSCOPY      FAMILY HISTORY Family History  Problem Relation Age of Onset  . Cancer Mother   . Diabetes Mellitus II Mother   . Diabetes Mother   . Diabetes Mellitus II Father   . Diabetes Father   . Glaucoma Paternal  Aunt   . Diabetes Paternal Aunt   . Diabetes Paternal Grandmother   . Diabetes Paternal Grandfather     SOCIAL HISTORY Social History   Tobacco Use  . Smoking status: Former Smoker    Packs/day: 0.25    Years: 28.00    Pack years: 7.00    Types: Cigarettes    Quit date: 08/19/2018    Years since quitting: 0.2  . Smokeless tobacco: Former Neurosurgeon    Quit date: 07/13/1985  Substance Use Topics  . Alcohol use: Yes    Comment: 2-3 drinks   . Drug use: No         OPHTHALMIC EXAM:  Base Eye Exam    Visual Acuity (Snellen - Linear)      Right Left   Dist cc 20/20 20/20 -1   Correction: Glasses  Tonometry (Tonopen, 9:43 AM)      Right Left   Pressure 17 18       Pupils      Dark Light Shape React APD   Right 4 3 Round Slow None   Left 4 3 Round Slow None       Visual Fields (Counting fingers)      Left Right    Full Full       Extraocular Movement      Right Left    Full, Ortho Full, Ortho       Neuro/Psych    Oriented x3: Yes   Mood/Affect: Normal       Dilation    Both eyes: 1.0% Mydriacyl, 2.5% Phenylephrine @ 9:43 AM        Slit Lamp and Fundus Exam    Slit Lamp Exam      Right Left   Lids/Lashes mild Meibomian gland dysfunction mild Meibomian gland dysfunction   Conjunctiva/Sclera mild Melanosis mild Melanosis   Cornea Arcus, trace Punctate epithelial erosions Arcus, trace Punctate epithelial erosions   Anterior Chamber Deep and quiet Deep and quiet   Iris Round and dilated, No NVI Round and dilated, No NVI   Lens 2+ Nuclear sclerosis, 2+ Cortical cataract 2+ Nuclear sclerosis, 2+ Cortical cataract   Vitreous Vitreous syneresis Vitreous syneresis       Fundus Exam      Right Left   Disc Pink and Sharp Pink and Sharp, mild temporal PPP   C/D Ratio 0.4 0.4   Macula Flat, Good foveal reflex, mild Retinal pigment epithelial mottling, rare MA, no edema Flat, Good foveal reflex, mild Retinal pigment epithelial mottling, focal CWS IN macula,  rare MA, no edema   Vessels Mild Vascular attenuation, Tortuous, AV crossing changes Mild Vascular attenuation, Tortuous, AV crossing changes   Periphery Attached, rare MA Attached, rare MA        Refraction    Wearing Rx      Sphere Cylinder Axis Add   Right +0.50 +1.00 164 +2.00   Left +0.75 +0.75 160 +2.00       Manifest Refraction      Sphere Cylinder Axis Dist VA   Right +0.50 +0.75 180 20/20   Left +0.50 +0.75 180 20/20          IMAGING AND PROCEDURES  Imaging and Procedures for @  OCT, Retina - OU - Both Eyes       Right Eye Quality was good. Central Foveal Thickness: 260. Progression has no prior data. Findings include normal foveal contour, no SRF, no IRF, vitreomacular adhesion .   Left Eye Quality was good. Central Foveal Thickness: 255. Progression has no prior data. Findings include normal foveal contour, no IRF, no SRF, vitreomacular adhesion  (Focal IRHM / CWS IN macula).   Notes *Images captured and stored on drive  Diagnosis / Impression:  NFP, no IRF/SRF OU No DME OU OS: Focal IRHM / CWS IN macula  Clinical management:  See below  Abbreviations: NFP - Normal foveal profile. CME - cystoid macular edema. PED - pigment epithelial detachment. IRF - intraretinal fluid. SRF - subretinal fluid. EZ - ellipsoid zone. ERM - epiretinal membrane. ORA - outer retinal atrophy. ORT - outer retinal tubulation. SRHM - subretinal hyper-reflective material                 ASSESSMENT/PLAN:    ICD-10-CM   1. Mild nonproliferative diabetic retinopathy of both eyes without macular edema associated  with type 2 diabetes mellitus (HCC)  N82.9562   2. Retinal edema  H35.81 OCT, Retina - OU - Both Eyes  3. Essential hypertension  I10   4. Hypertensive retinopathy of both eyes  H35.033   5. Cotton wool spots  H35.81   6. Combined forms of age-related cataract of both eyes  H25.813     1,2. Mild nonproliferative diabetic retinopathy w/o DME, both eyes  -  The incidence, risk factors for progression, natural history and treatment options for diabetic retinopathy were discussed with patient.    - The need for close monitoring of blood glucose, blood pressure, and serum lipids, avoiding cigarette or any type of tobacco, and the need for long term follow up was also discussed with patient.  - exam shows rare MA OU   - OCT without diabetic macular edema, both eyes   - f/u in 1 year  3,4. Hypertensive retinopathy OU  - discussed importance of tight BP control  - monitor  5. Cotton Wool Spot OS  - focal CWS along IT arcades  - likely related to DM and HTN  - discussed findings, prognosis  - monitor  6. Mixed form age related cataract OU  - The symptoms of cataract, surgical options, and treatments and risks were discussed with patient.  - discussed diagnosis and progression  - not yet visually significant  - monitor for now   Ophthalmic Meds Ordered this visit:  No orders of the defined types were placed in this encounter.      Return in about 1 year (around 11/24/2019) for f/u DM exam, DFE, OCT.  There are no Patient Instructions on file for this visit.   Explained the diagnoses, plan, and follow up with the patient and they expressed understanding.  Patient expressed understanding of the importance of proper follow up care.   This document serves as a record of services personally performed by Karie Chimera, MD, PhD. It was created on their behalf by Cristopher Estimable, COT an ophthalmic technician. The creation of this record is the provider's dictation and/or activities during the visit.    Electronically signed by: Cristopher Estimable, COT 11/19/18 @ 12:06 AM   This document serves as a record of services personally performed by Karie Chimera, MD, PhD. It was created on their behalf by Laurian Brim, OA, an ophthalmic assistant. The creation of this record is the provider's dictation and/or activities during the visit.     Electronically signed by: Laurian Brim, OA 11.17.2020 12:06 AM  Karie Chimera, M.D., Ph.D. Diseases & Surgery of the Retina and Vitreous [facility] 11/24/2018   I have reviewed the above documentation for accuracy and completeness, and I agree with the above. Karie Chimera, M.D., Ph.D. 11/25/18 12:07 AM    Abbreviations: M myopia (nearsighted); A astigmatism; H hyperopia (farsighted); P presbyopia; Mrx spectacle prescription;  CTL contact lenses; OD right eye; OS left eye; OU both eyes  XT exotropia; ET esotropia; PEK punctate epithelial keratitis; PEE punctate epithelial erosions; DES dry eye syndrome; MGD meibomian gland dysfunction; ATs artificial tears; PFAT's preservative free artificial tears; NSC nuclear sclerotic cataract; PSC posterior subcapsular cataract; ERM epi-retinal membrane; PVD posterior vitreous detachment; RD retinal detachment; DM diabetes mellitus; DR diabetic retinopathy; NPDR non-proliferative diabetic retinopathy; PDR proliferative diabetic retinopathy; CSME clinically significant macular edema; DME diabetic macular edema; dbh dot blot hemorrhages; CWS cotton wool spot; POAG primary open angle glaucoma; C/D cup-to-disc ratio; HVF humphrey visual field; GVF  goldmann visual field; OCT optical coherence tomography; IOP intraocular pressure; BRVO Branch retinal vein occlusion; CRVO central retinal vein occlusion; CRAO central retinal artery occlusion; BRAO branch retinal artery occlusion; RT retinal tear; SB scleral buckle; PPV pars plana vitrectomy; VH Vitreous hemorrhage; PRP panretinal laser photocoagulation; IVK intravitreal kenalog; VMT vitreomacular traction; MH Macular hole;  NVD neovascularization of the disc; NVE neovascularization elsewhere; AREDS age related eye disease study; ARMD age related macular degeneration; POAG primary open angle glaucoma; EBMD epithelial/anterior basement membrane dystrophy; ACIOL anterior chamber intraocular lens;  IOL intraocular lens; PCIOL posterior chamber intraocular lens; Phaco/IOL phacoemulsification with intraocular lens placement; PRK photorefractive keratectomy; LASIK laser assisted in situ keratomileusis; HTN hypertension; DM diabetes mellitus; COPD chronic obstructive pulmonary disease

## 2018-11-24 ENCOUNTER — Ambulatory Visit (INDEPENDENT_AMBULATORY_CARE_PROVIDER_SITE_OTHER): Payer: Medicare Other | Admitting: Ophthalmology

## 2018-11-24 ENCOUNTER — Encounter (INDEPENDENT_AMBULATORY_CARE_PROVIDER_SITE_OTHER): Payer: Self-pay | Admitting: Ophthalmology

## 2018-11-24 ENCOUNTER — Other Ambulatory Visit: Payer: Self-pay

## 2018-11-24 DIAGNOSIS — I10 Essential (primary) hypertension: Secondary | ICD-10-CM | POA: Diagnosis not present

## 2018-11-24 DIAGNOSIS — H3581 Retinal edema: Secondary | ICD-10-CM | POA: Diagnosis not present

## 2018-11-24 DIAGNOSIS — H35033 Hypertensive retinopathy, bilateral: Secondary | ICD-10-CM | POA: Diagnosis not present

## 2018-11-24 DIAGNOSIS — E113293 Type 2 diabetes mellitus with mild nonproliferative diabetic retinopathy without macular edema, bilateral: Secondary | ICD-10-CM

## 2018-11-24 DIAGNOSIS — H25813 Combined forms of age-related cataract, bilateral: Secondary | ICD-10-CM

## 2019-03-01 ENCOUNTER — Other Ambulatory Visit: Payer: Self-pay

## 2019-03-01 ENCOUNTER — Other Ambulatory Visit
Admission: RE | Admit: 2019-03-01 | Discharge: 2019-03-01 | Disposition: A | Discharge status: Home / Self Care | Payer: Medicare Other | Source: Ambulatory Visit | Attending: Internal Medicine | Admitting: Internal Medicine

## 2019-03-01 DIAGNOSIS — Z20822 Contact with and (suspected) exposure to covid-19: Secondary | ICD-10-CM | POA: Diagnosis not present

## 2019-03-01 DIAGNOSIS — Z01812 Encounter for preprocedural laboratory examination: Secondary | ICD-10-CM | POA: Diagnosis present

## 2019-03-02 ENCOUNTER — Encounter: Payer: Self-pay | Admitting: Internal Medicine

## 2019-03-02 LAB — SARS CORONAVIRUS 2 (TAT 6-24 HRS): SARS Coronavirus 2: NEGATIVE

## 2019-03-03 ENCOUNTER — Ambulatory Visit
Admission: RE | Admit: 2019-03-03 | Discharge: 2019-03-03 | Disposition: A | Discharge status: Home / Self Care | Payer: Medicare Other | Attending: Internal Medicine | Admitting: Internal Medicine

## 2019-03-03 ENCOUNTER — Encounter
Admission: RE | Disposition: A | Discharge status: Home / Self Care | Payer: Self-pay | Source: Home / Self Care | Attending: Internal Medicine

## 2019-03-03 ENCOUNTER — Ambulatory Visit: Payer: Medicare Other | Admitting: Anesthesiology

## 2019-03-03 ENCOUNTER — Encounter: Payer: Self-pay | Admitting: Internal Medicine

## 2019-03-03 ENCOUNTER — Other Ambulatory Visit: Payer: Self-pay

## 2019-03-03 DIAGNOSIS — I272 Pulmonary hypertension, unspecified: Secondary | ICD-10-CM | POA: Insufficient documentation

## 2019-03-03 DIAGNOSIS — N183 Chronic kidney disease, stage 3 unspecified: Secondary | ICD-10-CM | POA: Diagnosis not present

## 2019-03-03 DIAGNOSIS — K222 Esophageal obstruction: Secondary | ICD-10-CM | POA: Insufficient documentation

## 2019-03-03 DIAGNOSIS — Z87891 Personal history of nicotine dependence: Secondary | ICD-10-CM | POA: Insufficient documentation

## 2019-03-03 DIAGNOSIS — I129 Hypertensive chronic kidney disease with stage 1 through stage 4 chronic kidney disease, or unspecified chronic kidney disease: Secondary | ICD-10-CM | POA: Diagnosis not present

## 2019-03-03 DIAGNOSIS — K2100 Gastro-esophageal reflux disease with esophagitis, without bleeding: Secondary | ICD-10-CM | POA: Diagnosis not present

## 2019-03-03 DIAGNOSIS — I7300 Raynaud's syndrome without gangrene: Secondary | ICD-10-CM | POA: Insufficient documentation

## 2019-03-03 DIAGNOSIS — E1122 Type 2 diabetes mellitus with diabetic chronic kidney disease: Secondary | ICD-10-CM | POA: Insufficient documentation

## 2019-03-03 DIAGNOSIS — Z7984 Long term (current) use of oral hypoglycemic drugs: Secondary | ICD-10-CM | POA: Insufficient documentation

## 2019-03-03 DIAGNOSIS — E1151 Type 2 diabetes mellitus with diabetic peripheral angiopathy without gangrene: Secondary | ICD-10-CM | POA: Insufficient documentation

## 2019-03-03 DIAGNOSIS — K449 Diaphragmatic hernia without obstruction or gangrene: Secondary | ICD-10-CM | POA: Insufficient documentation

## 2019-03-03 DIAGNOSIS — Z7982 Long term (current) use of aspirin: Secondary | ICD-10-CM | POA: Insufficient documentation

## 2019-03-03 DIAGNOSIS — Z9981 Dependence on supplemental oxygen: Secondary | ICD-10-CM | POA: Diagnosis not present

## 2019-03-03 DIAGNOSIS — Z79899 Other long term (current) drug therapy: Secondary | ICD-10-CM | POA: Insufficient documentation

## 2019-03-03 DIAGNOSIS — Z791 Long term (current) use of non-steroidal anti-inflammatories (NSAID): Secondary | ICD-10-CM | POA: Diagnosis not present

## 2019-03-03 DIAGNOSIS — K219 Gastro-esophageal reflux disease without esophagitis: Secondary | ICD-10-CM | POA: Diagnosis present

## 2019-03-03 HISTORY — PX: ESOPHAGOGASTRODUODENOSCOPY (EGD) WITH PROPOFOL: SHX5813

## 2019-03-03 LAB — GLUCOSE, CAPILLARY: Glucose-Capillary: 132 mg/dL — ABNORMAL HIGH (ref 70–99)

## 2019-03-03 SURGERY — ESOPHAGOGASTRODUODENOSCOPY (EGD) WITH PROPOFOL
Anesthesia: General

## 2019-03-03 MED ORDER — SODIUM CHLORIDE 0.9 % IV SOLN: INTRAVENOUS | Status: DC

## 2019-03-03 MED ORDER — PROPOFOL 500 MG/50ML IV EMUL
INTRAVENOUS | Status: AC
  Filled 2019-03-03: qty 50

## 2019-03-03 MED ORDER — PROPOFOL 10 MG/ML IV BOLUS
INTRAVENOUS | Status: DC | PRN
  Administered 2019-03-03: 40 mg via INTRAVENOUS
  Administered 2019-03-03: 70 mg via INTRAVENOUS

## 2019-03-03 MED ORDER — EPHEDRINE SULFATE 50 MG/ML IJ SOLN
INTRAMUSCULAR | Status: AC
  Filled 2019-03-03: qty 1

## 2019-03-03 MED ORDER — PHENYLEPHRINE HCL (PRESSORS) 10 MG/ML IV SOLN
INTRAVENOUS | Status: AC
  Filled 2019-03-03: qty 1

## 2019-03-03 MED ORDER — LIDOCAINE HCL (PF) 2 % IJ SOLN
INTRAMUSCULAR | Status: AC
  Filled 2019-03-03: qty 10

## 2019-03-03 MED ORDER — LIDOCAINE HCL (CARDIAC) PF 100 MG/5ML IV SOSY
PREFILLED_SYRINGE | INTRAVENOUS | Status: DC | PRN
  Administered 2019-03-03: 50 mg via INTRAVENOUS

## 2019-03-03 MED ORDER — PROPOFOL 500 MG/50ML IV EMUL
INTRAVENOUS | Status: DC | PRN
  Administered 2019-03-03: 175 ug/kg/min via INTRAVENOUS

## 2019-03-03 NOTE — Interval H&P Note (Signed)
History and Physical Interval Note:  03/03/2019 8:34 AM  Joe Mathews  has presented today for surgery, with the diagnosis of GERD.  The various methods of treatment have been discussed with the patient and family. After consideration of risks, benefits and other options for treatment, the patient has consented to  Procedure(s): ESOPHAGOGASTRODUODENOSCOPY (EGD) WITH PROPOFOL (N/A) as a surgical intervention.  The patient's history has been reviewed, patient examined, no change in status, stable for surgery.  I have reviewed the patient's chart and labs.  Questions were answered to the patient's satisfaction.     [place], [place]

## 2019-03-03 NOTE — Anesthesia Procedure Notes (Signed)
Date/Time: 03/03/2019 8:31 AM Performed by: Ginger Carne, CRNA Pre-anesthesia Checklist: Patient identified, Emergency Drugs available, Suction available, Patient being monitored and Timeout performed Patient Re-evaluated:Patient Re-evaluated prior to induction Oxygen Delivery Method: Nasal cannula Preoxygenation: Pre-oxygenation with 100% oxygen Induction Type: IV induction

## 2019-03-03 NOTE — Anesthesia Postprocedure Evaluation (Signed)
Anesthesia Post Note  Patient: Joe Mathews  Procedure(s) Performed: ESOPHAGOGASTRODUODENOSCOPY (EGD) WITH PROPOFOL (N/A )  Patient location during evaluation: Endoscopy Anesthesia Type: General Level of consciousness: awake and alert Pain management: pain level controlled Vital Signs Assessment: post-procedure vital signs reviewed and stable Respiratory status: spontaneous breathing, nonlabored ventilation, respiratory function stable and patient connected to nasal cannula oxygen Cardiovascular status: blood pressure returned to baseline and stable Postop Assessment: no apparent nausea or vomiting Anesthetic complications: no     Last Vitals:  Vitals:   03/03/19 0909 03/03/19 0919  BP: (!) 142/95 (!) 142/87  Pulse:    Resp:    Temp:    SpO2:      Last Pain:  Vitals:   03/03/19 0919  TempSrc:   PainSc: 0-No pain                 Lenard Simmer

## 2019-03-03 NOTE — Op Note (Signed)
[facility] Gastroenterology Patient Name: Joe Mathews Procedure Date: 03/03/2019 8:29 AM MRN: [id number] Account #: [id number] Date of Birth: 08/24/1965 Admit Type: Outpatient Age: 54 Room: [facility] ENDO ROOM 3 Gender: Male Note Status: Finalized Procedure:             Upper GI endoscopy Indications:           Esophageal dysphagia, Esophageal reflux, Stricture of                         the esophagus Providers:             Boykin Nearing. Prajna Vanderpool MD, MD Medicines:             Propofol per Anesthesia Complications:         No immediate complications. Estimated blood loss:                         Minimal. Procedure:             Pre-Anesthesia Assessment:                        - The risks and benefits of the procedure and the                         sedation options and risks were discussed with the                         patient. All questions were answered and informed                         consent was obtained.                        - Patient identification and proposed procedure were                         verified prior to the procedure by the nurse. The                         procedure was verified in the procedure room.                        - ASA Grade Assessment: III - A patient with severe                         systemic disease.                        - After reviewing the risks and benefits, the patient                         was deemed in satisfactory condition to undergo the                         procedure.                        After obtaining informed consent, the endoscope was  passed under direct vision. Throughout the procedure,                         the patient's blood pressure, pulse, and oxygen                         saturations were monitored continuously. The Endoscope                         was introduced through the mouth, and advanced to the                         third part of duodenum. The upper GI  endoscopy was                         accomplished without difficulty. The patient tolerated                         the procedure well. Findings:      LA Grade C (one or more mucosal breaks continuous between tops of 2 or       more mucosal folds, less than 75% circumference) esophagitis with no       bleeding was found in the lower third of the esophagus.      Two benign-appearing, intrinsic moderate stenoses were found in the       distal esophagus. The narrowest stenosis measured 1.3 cm (inner       diameter) x less than one cm (in length). The stenoses were traversed. A       TTS dilator was passed through the scope. Dilation with a 15-16.5-18 mm       balloon dilator was performed to 16.5 mm. The dilation site was examined       following endoscope reinsertion and showed moderate mucosal disruption.       Estimated blood loss was minimal.      A 3 cm hiatal hernia was present.      The examined duodenum was normal.      The exam was otherwise without abnormality. Impression:            - LA Grade C reflux esophagitis with no bleeding.                        - Benign-appearing esophageal stenoses. Dilated.                        - 3 cm hiatal hernia.                        - Normal examined duodenum.                        - The examination was otherwise normal.                        - No specimens collected. Recommendation:        - Patient has a contact number available for                         emergencies. The signs and symptoms of potential  delayed complications were discussed with the patient.                         Return to normal activities tomorrow. Written                         discharge instructions were provided to the patient.                        - Mechanical soft diet today.                        - Advance diet as tolerated.                        - Evaluate for possible resuming of PPI unless well                         documented  renal insufficiency side effect as                         mentioned by patient today. If PPI contraindicated,                         will be limited to H2 blockers/carafate for therapy.                        - Return to physician assistant in 6 weeks.                        - The findings and recommendations were discussed with                         the patient. Procedure Code(s):     --- Professional ---                        [phone], Esophagogastroduodenoscopy, flexible,                         transoral; with transendoscopic balloon dilation of                         esophagus (less than 30 mm diameter) Diagnosis Code(s):     --- Professional ---                        R13.14, Dysphagia, pharyngoesophageal phase                        K44.9, Diaphragmatic hernia without obstruction or                         gangrene                        K22.2, Esophageal obstruction                        K21.00, Gastro-esophageal reflux disease with  esophagitis, without bleeding CPT copyright 2019 American Medical Association. All rights reserved. The codes documented in this report are preliminary and upon coder review may  be revised to meet current compliance requirements. Efrain Sella MD, MD 03/03/2019 8:52:19 AM This report has been signed electronically. Number of Addenda: 0 Note Initiated On: 03/03/2019 8:29 AM Estimated Blood Loss:  Estimated blood loss was minimal.      [facility]

## 2019-03-03 NOTE — Transfer of Care (Signed)
Immediate Anesthesia Transfer of Care Note  Patient: EILAM SHREWSBURY  Procedure(s) Performed: ESOPHAGOGASTRODUODENOSCOPY (EGD) WITH PROPOFOL (N/A )  Patient Location: PACU  Anesthesia Type:General  Level of Consciousness: awake and alert   Airway & Oxygen Therapy: Patient Spontanous Breathing and Patient connected to nasal cannula oxygen  Post-op Assessment: Report given to RN and Post -op Vital signs reviewed and stable  Post vital signs: Reviewed and stable  Last Vitals:  Vitals Value Taken Time  BP 156/88 03/03/19 0851  Temp 36.7 C 03/03/19 0849  Pulse 91 03/03/19 0852  Resp 24 03/03/19 0852  SpO2 94 % 03/03/19 0852  Vitals shown include unvalidated device data.  Last Pain:  Vitals:   03/03/19 0849  TempSrc: Temporal  PainSc: 0-No pain         Complications: No apparent anesthesia complications

## 2019-03-03 NOTE — Anesthesia Preprocedure Evaluation (Signed)
Anesthesia Evaluation  Patient identified by MRN, date of birth, ID band Patient awake    Reviewed: Allergy & Precautions, NPO status , Patient's Chart, lab work & pertinent test results, reviewed documented beta blocker date and time   History of Anesthesia Complications Negative for: history of anesthetic complications  Airway Mallampati: III  TM Distance: >3 FB     Dental  (+) Chipped, Dental Advidsory Given   Pulmonary Current Smoker, former smoker,  Pulmonary HTN and pulmonary fibrosis          Cardiovascular hypertension, + Peripheral Vascular Disease  + dysrhythmias      Neuro/Psych negative neurological ROS  negative psych ROS   GI/Hepatic Neg liver ROS, GERD  ,  Endo/Other  diabetes, Type 2  Renal/GU CRFRenal disease     Musculoskeletal   Abdominal   Peds  Hematology   Anesthesia Other Findings Past Medical History: No date: Chronic kidney disease     Comment:  stage 3 No date: Collagen vascular disease (HCC) No date: Diabetes mellitus without complication (HCC) No date: Dysphagia No date: Dysrhythmia No date: Esophagitis No date: GERD (gastroesophageal reflux disease) No date: Hypertension No date: NSIP (nonspecific interstitial pneumonia) (HCC) No date: Pulmonary fibrosis (HCC) No date: Pulmonary hypertension (HCC) No date: Raynaud disease No date: Raynaud's disease No date: Schatzki's ring No date: Sclerodactyly No date: Scleroderma (HCC)   Reproductive/Obstetrics                             Anesthesia Physical  Anesthesia Plan  ASA: III  Anesthesia Plan: General   Post-op Pain Management:    Induction: Intravenous  PONV Risk Score and Plan: 2 and Propofol infusion and TIVA  Airway Management Planned: Nasal Cannula and Natural Airway  Additional Equipment:   Intra-op Plan:   Post-operative Plan:   Informed Consent: I have reviewed the patients  History and Physical, chart, labs and discussed the procedure including the risks, benefits and alternatives for the proposed anesthesia with the patient or authorized representative who has indicated his/her understanding and acceptance.       Plan Discussed with: CRNA  Anesthesia Plan Comments:         Anesthesia Quick Evaluation  

## 2019-03-03 NOTE — H&P (Signed)
Outpatient short stay form Pre-procedure 03/03/2019 8:33 AM Joe Mathews K. Alice Reichert, M.D.  Primary Physician: Tracie Harrier, M.D.  Reason for visit:  GERD, dysphagia, hx of esophageal stricture.  History of present illness: patient with recurrent dysphagia for the last few years with hx of esophageal stricture .    Current Facility-Administered Medications:  .  0.9 %  sodium chloride infusion, , Intravenous, Continuous, Churdan, Benay Pike, MD, Last Rate: 20 mL/hr at 03/03/19 0824, New Bag at 03/03/19 0938  Medications Prior to Admission  Medication Sig Dispense Refill Last Dose  . ADCIRCA 20 MG TABS Take 1 tablet by mouth 2 (two) times daily.   03/02/2019 at Unknown time  . aspirin EC 81 MG tablet Take 81 mg by mouth daily.   03/02/2019 at Unknown time  . cloNIDine (CATAPRES - DOSED IN MG/24 HR) 0.2 mg/24hr patch Place 0.2 mg onto the skin once a week.   03/03/2019 at 0630  . doxazosin (CARDURA) 4 MG tablet Take 4 mg by mouth daily.   03/03/2019 at 0630  . famotidine (PEPCID) 40 MG tablet Take 40 mg by mouth daily.   03/02/2019 at Unknown time  . glimepiride (AMARYL) 4 MG tablet Take 4 mg by mouth daily with breakfast.   03/02/2019 at Unknown time  . linagliptin (TRADJENTA) 5 MG TABS tablet Take 5 mg by mouth daily.   03/02/2019 at Unknown time  . Liraglutide (VICTOZA) 18 MG/3ML SOPN Inject 1.8 mg into the skin daily.   03/02/2019 at Unknown time  . losartan (COZAAR) 100 MG tablet Take 100 mg by mouth daily.   03/03/2019 at 0630  . Macitentan (OPSUMIT) 10 MG TABS Take 10 mg by mouth daily.   03/02/2019 at Unknown time  . meloxicam (MOBIC) 7.5 MG tablet Take 7.5 mg by mouth daily.   03/02/2019 at Unknown time  . metFORMIN (GLUCOPHAGE) 1000 MG tablet Take 1,000 mg by mouth 2 (two) times daily with a meal.   03/02/2019 at Unknown time  . mycophenolate (CELLCEPT) 500 MG tablet Take 1,500 mg by mouth 2 (two) times daily.   03/02/2019 at Unknown time  . NIFEdipine (ADALAT CC) 90 MG 24 hr tablet Take 90 mg by  mouth daily.   03/03/2019 at 0630  . potassium chloride (MICRO-K) 10 MEQ CR capsule Take by mouth.   03/02/2019 at Unknown time  . ranitidine (ZANTAC) 300 MG tablet Take 300 mg by mouth daily before breakfast.   03/02/2019 at Unknown time  . TADALAFIL PO Take by mouth daily.   03/02/2019 at Unknown time  . valsartan (DIOVAN) 320 MG tablet Take 320 mg by mouth daily.   03/03/2019 at 0630  . furosemide (LASIX) 40 MG tablet Take 40 mg by mouth daily as needed.      . OXYGEN Inhale 2 L/min into the lungs continuous.   Not Taking at Unknown time     Allergies  Allergen Reactions  . Omeprazole   . Protonix [Pantoprazole Sodium]      Past Medical History:  Diagnosis Date  . Chronic kidney disease    stage 3  . Collagen vascular disease ([place])   . Diabetes mellitus without complication ([place])   . Dysphagia   . Dysrhythmia   . Esophagitis   . GERD (gastroesophageal reflux disease)   . Hypertension   . NSIP (nonspecific interstitial pneumonia) ([place])   . Pulmonary fibrosis ([place])   . Pulmonary hypertension ([place])   . Raynaud disease   . Raynaud's disease   . Schatzki's  ring   . Sclerodactyly   . Scleroderma (HCC)     Review of systems:  Otherwise negative.    Physical Exam  Gen: Alert, oriented. Appears stated age.  HEENT: [place]/AT. PERRLA. Lungs: CTA, no wheezes. CV: RR nl S1, S2. Abd: soft, benign, no masses. BS+ Ext: No edema. Pulses 2+    Planned procedures: Proceed with EGD. The patient understands the nature of the planned procedure, indications, risks, alternatives and potential complications including but not limited to bleeding, infection, perforation, damage to internal organs and possible oversedation/side effects from anesthesia. The patient agrees and gives consent to proceed.  Please refer to procedure notes for findings, recommendations and patient disposition/instructions.     Kathrene Sinopoli K. Norma Fredrickson, M.D. Gastroenterology 03/03/2019  8:33 AM

## 2019-03-04 ENCOUNTER — Encounter: Payer: Self-pay | Admitting: *Deleted

## 2019-09-23 ENCOUNTER — Other Ambulatory Visit: Payer: Self-pay | Admitting: Student

## 2019-09-23 DIAGNOSIS — R1319 Other dysphagia: Secondary | ICD-10-CM

## 2019-09-23 DIAGNOSIS — Z8719 Personal history of other diseases of the digestive system: Secondary | ICD-10-CM

## 2019-09-23 DIAGNOSIS — K2100 Gastro-esophageal reflux disease with esophagitis, without bleeding: Secondary | ICD-10-CM

## 2019-09-23 DIAGNOSIS — K449 Diaphragmatic hernia without obstruction or gangrene: Secondary | ICD-10-CM

## 2019-10-07 ENCOUNTER — Ambulatory Visit
Admission: RE | Admit: 2019-10-07 | Discharge: 2019-10-07 | Disposition: A | Discharge status: Home / Self Care | Payer: Medicare Other | Source: Ambulatory Visit | Attending: Student | Admitting: Student

## 2019-10-07 ENCOUNTER — Other Ambulatory Visit: Payer: Self-pay

## 2019-10-07 DIAGNOSIS — K2100 Gastro-esophageal reflux disease with esophagitis, without bleeding: Secondary | ICD-10-CM | POA: Diagnosis present

## 2019-10-07 DIAGNOSIS — Z8719 Personal history of other diseases of the digestive system: Secondary | ICD-10-CM | POA: Diagnosis present

## 2019-10-07 DIAGNOSIS — R1310 Dysphagia, unspecified: Secondary | ICD-10-CM | POA: Diagnosis not present

## 2019-10-07 DIAGNOSIS — R1319 Other dysphagia: Secondary | ICD-10-CM

## 2019-10-07 DIAGNOSIS — K449 Diaphragmatic hernia without obstruction or gangrene: Secondary | ICD-10-CM | POA: Insufficient documentation

## 2019-10-29 ENCOUNTER — Other Ambulatory Visit: Payer: Self-pay

## 2019-10-29 ENCOUNTER — Other Ambulatory Visit
Admission: RE | Admit: 2019-10-29 | Discharge: 2019-10-29 | Disposition: A | Discharge status: Home / Self Care | Payer: Medicare Other | Source: Ambulatory Visit | Attending: Gastroenterology | Admitting: Gastroenterology

## 2019-10-29 DIAGNOSIS — Z20822 Contact with and (suspected) exposure to covid-19: Secondary | ICD-10-CM | POA: Insufficient documentation

## 2019-10-29 DIAGNOSIS — Z01812 Encounter for preprocedural laboratory examination: Secondary | ICD-10-CM | POA: Diagnosis present

## 2019-10-30 LAB — SARS CORONAVIRUS 2 (TAT 6-24 HRS): SARS Coronavirus 2: NEGATIVE

## 2019-11-01 ENCOUNTER — Encounter: Payer: Self-pay | Admitting: *Deleted

## 2019-11-02 ENCOUNTER — Encounter: Payer: Self-pay | Admitting: *Deleted

## 2019-11-02 ENCOUNTER — Ambulatory Visit
Admission: RE | Admit: 2019-11-02 | Discharge: 2019-11-02 | Disposition: A | Discharge status: Home / Self Care | Payer: Medicare Other | Attending: Gastroenterology | Admitting: Gastroenterology

## 2019-11-02 ENCOUNTER — Encounter
Admission: RE | Disposition: A | Discharge status: Home / Self Care | Payer: Self-pay | Source: Home / Self Care | Attending: Gastroenterology

## 2019-11-02 ENCOUNTER — Ambulatory Visit: Payer: Medicare Other | Admitting: Anesthesiology

## 2019-11-02 ENCOUNTER — Other Ambulatory Visit: Payer: Self-pay

## 2019-11-02 DIAGNOSIS — I129 Hypertensive chronic kidney disease with stage 1 through stage 4 chronic kidney disease, or unspecified chronic kidney disease: Secondary | ICD-10-CM | POA: Insufficient documentation

## 2019-11-02 DIAGNOSIS — R1310 Dysphagia, unspecified: Secondary | ICD-10-CM | POA: Diagnosis present

## 2019-11-02 DIAGNOSIS — K640 First degree hemorrhoids: Secondary | ICD-10-CM | POA: Diagnosis not present

## 2019-11-02 DIAGNOSIS — K2100 Gastro-esophageal reflux disease with esophagitis, without bleeding: Secondary | ICD-10-CM | POA: Diagnosis not present

## 2019-11-02 DIAGNOSIS — I7300 Raynaud's syndrome without gangrene: Secondary | ICD-10-CM | POA: Insufficient documentation

## 2019-11-02 DIAGNOSIS — N183 Chronic kidney disease, stage 3 unspecified: Secondary | ICD-10-CM | POA: Diagnosis not present

## 2019-11-02 DIAGNOSIS — K222 Esophageal obstruction: Secondary | ICD-10-CM | POA: Insufficient documentation

## 2019-11-02 DIAGNOSIS — R195 Other fecal abnormalities: Secondary | ICD-10-CM | POA: Diagnosis not present

## 2019-11-02 DIAGNOSIS — J8410 Pulmonary fibrosis, unspecified: Secondary | ICD-10-CM | POA: Insufficient documentation

## 2019-11-02 DIAGNOSIS — E1122 Type 2 diabetes mellitus with diabetic chronic kidney disease: Secondary | ICD-10-CM | POA: Diagnosis not present

## 2019-11-02 DIAGNOSIS — I272 Pulmonary hypertension, unspecified: Secondary | ICD-10-CM | POA: Insufficient documentation

## 2019-11-02 DIAGNOSIS — K449 Diaphragmatic hernia without obstruction or gangrene: Secondary | ICD-10-CM | POA: Diagnosis not present

## 2019-11-02 DIAGNOSIS — M349 Systemic sclerosis, unspecified: Secondary | ICD-10-CM | POA: Diagnosis not present

## 2019-11-02 DIAGNOSIS — Z888 Allergy status to other drugs, medicaments and biological substances status: Secondary | ICD-10-CM | POA: Diagnosis not present

## 2019-11-02 DIAGNOSIS — E1151 Type 2 diabetes mellitus with diabetic peripheral angiopathy without gangrene: Secondary | ICD-10-CM | POA: Diagnosis not present

## 2019-11-02 HISTORY — DX: Hyperlipidemia, unspecified: E78.5

## 2019-11-02 HISTORY — PX: ESOPHAGOGASTRODUODENOSCOPY (EGD) WITH PROPOFOL: SHX5813

## 2019-11-02 HISTORY — PX: COLONOSCOPY WITH PROPOFOL: SHX5780

## 2019-11-02 LAB — GLUCOSE, CAPILLARY: Glucose-Capillary: 151 mg/dL — ABNORMAL HIGH (ref 70–99)

## 2019-11-02 SURGERY — ESOPHAGOGASTRODUODENOSCOPY (EGD) WITH PROPOFOL
Anesthesia: General

## 2019-11-02 MED ORDER — PROPOFOL 500 MG/50ML IV EMUL
INTRAVENOUS | Status: DC | PRN
  Administered 2019-11-02: 150 ug/kg/min via INTRAVENOUS

## 2019-11-02 MED ORDER — PROPOFOL 10 MG/ML IV BOLUS
INTRAVENOUS | Status: DC | PRN
  Administered 2019-11-02: 50 mg via INTRAVENOUS

## 2019-11-02 MED ORDER — SODIUM CHLORIDE 0.9 % IV SOLN: INTRAVENOUS | Status: DC

## 2019-11-02 MED ORDER — PROPOFOL 500 MG/50ML IV EMUL
INTRAVENOUS | Status: AC
  Filled 2019-11-02: qty 50

## 2019-11-02 NOTE — H&P (Signed)
Outpatient short stay form Pre-procedure 11/02/2019 10:20 AM Joe Lot MD, MPH  Primary Physician: Dr. Marcello Fennel  Reason for visit:  Dysphagia and positive FIT test  History of present illness:   54 y/o gentleman with history of scleroderma and multiple EGD's for dilations of strictures. Patient states he is allergic PPI due to "kidney" issues. Last EGD was six months ago with two strictures noted. Had colonoscopy in 2017 that was overall normal. No family history of GI malignancies but had a IDA so stool cards were checked and 1/2 positive. No blood thinners or abdominal surgeries.    Current Facility-Administered Medications:  .  0.9 %  sodium chloride infusion, , Intravenous, Continuous, Ashima Shrake, Rossie Muskrat, MD, Last Rate: 20 mL/hr at 11/02/19 1011, New Bag at 11/02/19 1011  Medications Prior to Admission  Medication Sig Dispense Refill Last Dose  . doxazosin (CARDURA) 4 MG tablet Take 4 mg by mouth daily.   11/02/2019 at Unknown time  . ADCIRCA 20 MG TABS Take 1 tablet by mouth 2 (two) times daily.     Marland Kitchen aspirin EC 81 MG tablet Take 81 mg by mouth daily.     . cloNIDine (CATAPRES - DOSED IN MG/24 HR) 0.2 mg/24hr patch Place 0.2 mg onto the skin once a week.     . famotidine (PEPCID) 40 MG tablet Take 40 mg by mouth daily.     . furosemide (LASIX) 40 MG tablet Take 40 mg by mouth daily as needed.      Marland Kitchen glimepiride (AMARYL) 4 MG tablet Take 4 mg by mouth daily with breakfast.     . linagliptin (TRADJENTA) 5 MG TABS tablet Take 5 mg by mouth daily.     Marland Kitchen losartan (COZAAR) 100 MG tablet Take 100 mg by mouth daily.     . Macitentan (OPSUMIT) 10 MG TABS Take 10 mg by mouth daily.     . meloxicam (MOBIC) 7.5 MG tablet Take 7.5 mg by mouth daily.     . metFORMIN (GLUCOPHAGE) 1000 MG tablet Take 1,000 mg by mouth 2 (two) times daily with a meal.     . mycophenolate (CELLCEPT) 500 MG tablet Take 1,500 mg by mouth 2 (two) times daily.     Marland Kitchen NIFEdipine (ADALAT CC) 90 MG 24 hr tablet Take 90  mg by mouth daily.     . OXYGEN Inhale 2 L/min into the lungs continuous.     . potassium chloride (MICRO-K) 10 MEQ CR capsule Take by mouth.     Marland Kitchen TADALAFIL PO Take by mouth daily.     . valsartan (DIOVAN) 320 MG tablet Take 320 mg by mouth daily.        Allergies  Allergen Reactions  . Omeprazole   . Protonix [Pantoprazole Sodium]      Past Medical History:  Diagnosis Date  . Chronic kidney disease    stage 3  . Collagen vascular disease (HCC)   . Diabetes mellitus without complication (HCC)   . Dysphagia   . Dysrhythmia   . Esophagitis   . GERD (gastroesophageal reflux disease)   . Hyperlipidemia   . Hypertension   . NSIP (nonspecific interstitial pneumonia) (HCC)   . NSIP (nonspecific interstitial pneumonia) (HCC)   . Pulmonary fibrosis (HCC)   . Pulmonary hypertension (HCC)   . Raynaud disease   . Raynaud's disease   . Schatzki's ring   . Sclerodactyly   . Scleroderma (HCC)     Review of systems:  Otherwise negative.  Physical Exam  Gen: Alert, oriented. Appears stated age.  HEENT: [place]/AT. PERRLA. Lungs: No respiratory distress CV: RRR Abd: soft, benign, no masses. Ext: No edema.     Planned procedures: Proceed with EGD/colonoscopy. The patient understands the nature of the planned procedure, indications, risks, alternatives and potential complications including but not limited to bleeding, infection, perforation, damage to internal organs and possible oversedation/side effects from anesthesia. The patient agrees and gives consent to proceed.  Please refer to procedure notes for findings, recommendations and patient disposition/instructions.     Joe Lot MD, MPH Gastroenterology 11/02/2019  10:20 AM

## 2019-11-02 NOTE — Op Note (Signed)
[facility] Gastroenterology Patient Name: Joe Mathews Procedure Date: 11/02/2019 10:21 AM MRN: [id number] Account #: [id number] Date of Birth: November 17, 1965 Admit Type: Outpatient Age: 54 Room: [facility] ENDO ROOM 1 Gender: Male Note Status: Finalized Procedure:             Upper GI endoscopy Indications:           Dysphagia Providers:             Andrey Farmer MD, MD Referring MD:          Tracie Harrier, MD (Referring MD) Medicines:             Monitored Anesthesia Care Complications:         No immediate complications. Estimated blood loss:                         Minimal. Procedure:             Pre-Anesthesia Assessment:                        - Prior to the procedure, a History and Physical was                         performed, and patient medications and allergies were                         reviewed. The patient is competent. The risks and                         benefits of the procedure and the sedation options and                         risks were discussed with the patient. All questions                         were answered and informed consent was obtained.                         Patient identification and proposed procedure were                         verified by the physician, the nurse, the anesthetist                         and the technician in the endoscopy suite. Mental                         Status Examination: alert and oriented. Airway                         Examination: normal oropharyngeal airway and neck                         mobility. Respiratory Examination: clear to                         auscultation. CV Examination: normal. Prophylactic                         Antibiotics: The  patient does not require prophylactic                         antibiotics. Prior Anticoagulants: The patient has                         taken no previous anticoagulant or antiplatelet                         agents. ASA Grade Assessment: III - A  patient with                         severe systemic disease. After reviewing the risks and                         benefits, the patient was deemed in satisfactory                         condition to undergo the procedure. The anesthesia                         plan was to use monitored anesthesia care (MAC).                         Immediately prior to administration of medications,                         the patient was re-assessed for adequacy to receive                         sedatives. The heart rate, respiratory rate, oxygen                         saturations, blood pressure, adequacy of pulmonary                         ventilation, and response to care were monitored                         throughout the procedure. The physical status of the                         patient was re-assessed after the procedure.                        After obtaining informed consent, the endoscope was                         passed under direct vision. Throughout the procedure,                         the patient's blood pressure, pulse, and oxygen                         saturations were monitored continuously. The Endoscope                         was introduced through the mouth, and advanced to the  second part of duodenum. The upper GI endoscopy was                         accomplished without difficulty. The patient tolerated                         the procedure well. Findings:      Two benign-appearing, intrinsic mild stenoses were found. The narrowest       stenosis measured 1.4 cm (inner diameter) x less than one cm (in       length). The stenoses were traversed. A TTS dilator was passed through       the scope. Dilation with a 15-16.5-18 mm balloon dilator was performed       to 16.5 mm. The dilation site was examined and showed moderate mucosal       disruption. There was one spot post dilation with slow oozing of blood.       For hemostasis, one hemostatic  clip was successfully placed. There was       no bleeding at the end of the procedure.      LA Grade B (one or more mucosal breaks greater than 5 mm, not extending       between the tops of two mucosal folds) esophagitis with no bleeding was       found.      A 3 cm hiatal hernia was present.      The entire examined stomach was normal.      The examined duodenum was normal. Impression:            - Benign-appearing esophageal stenoses. Dilated. Clip                         was placed due to small amount of oozing post                         intervention..                        - LA Grade B reflux esophagitis with no bleeding.                        - 3 cm hiatal hernia.                        - Normal stomach.                        - Normal examined duodenum.                        - No specimens collected. Recommendation:        - Discharge patient to home.                        - Resume previous diet.                        - Continue present medications.                        - Patient needs to be on PPI. Can speak with his  nephrologist but given recurrent strictures and                         esophagitis, he needs to start taking one.                        - Return to referring physician as previously                         scheduled. Procedure Code(s):     --- Professional ---                        [phone], Esophagogastroduodenoscopy, flexible,                         transoral; with control of bleeding, any method                        [id number], Esophagogastroduodenoscopy, flexible,                         transoral; with transendoscopic balloon dilation of                         esophagus (less than 30 mm diameter) Diagnosis Code(s):     --- Professional ---                        K22.2, Esophageal obstruction                        K21.00, Gastro-esophageal reflux disease with                         esophagitis, without bleeding                         K44.9, Diaphragmatic hernia without obstruction or                         gangrene                        R13.10, Dysphagia, unspecified CPT copyright 2019 American Medical Association. All rights reserved. The codes documented in this report are preliminary and upon coder review may  be revised to meet current compliance requirements. Andrey Farmer, MD Andrey Farmer MD, MD 11/02/2019 11:05:45 AM Number of Addenda: 0 Note Initiated On: 11/02/2019 10:21 AM Estimated Blood Loss:  Estimated blood loss was minimal.      [facility]

## 2019-11-02 NOTE — Interval H&P Note (Signed)
History and Physical Interval Note:  11/02/2019 10:27 AM  Joe Mathews  has presented today for surgery, with the diagnosis of heme positive ,Abnormal.  The various methods of treatment have been discussed with the patient and family. After consideration of risks, benefits and other options for treatment, the patient has consented to  Procedure(s): ESOPHAGOGASTRODUODENOSCOPY (EGD) WITH PROPOFOL (N/A) COLONOSCOPY WITH PROPOFOL (N/A) as a surgical intervention.  The patient's history has been reviewed, patient examined, no change in status, stable for surgery.  I have reviewed the patient's chart and labs.  Questions were answered to the patient's satisfaction.     Regis Bill  Ok to proceed with EGD/Colonoscopy

## 2019-11-02 NOTE — Transfer of Care (Signed)
Immediate Anesthesia Transfer of Care Note  Patient: Joe Mathews  Procedure(s) Performed: ESOPHAGOGASTRODUODENOSCOPY (EGD) WITH PROPOFOL (N/A ) COLONOSCOPY WITH PROPOFOL (N/A )  Patient Location: PACU and Endoscopy Unit  Anesthesia Type:General  Level of Consciousness: awake, alert  and oriented  Airway & Oxygen Therapy: Patient Spontanous Breathing  Post-op Assessment: Report given to RN and Post -op Vital signs reviewed and stable  Post vital signs: Reviewed and stable  Last Vitals:  Vitals Value Taken Time  BP 94/60 11/02/19 1103  Temp 36.6 C 11/02/19 1104  Pulse 83 11/02/19 1105  Resp 16 11/02/19 1105  SpO2 99 % 11/02/19 1105  Vitals shown include unvalidated device data.  Last Pain:  Vitals:   11/02/19 1104  TempSrc: Tympanic  PainSc: 0-No pain         Complications: No complications documented.

## 2019-11-02 NOTE — Op Note (Signed)
[facility] Gastroenterology Patient Name: Joe Mathews Procedure Date: 11/02/2019 10:21 AM MRN: [id number] Account #: [id number] Date of Birth: 10-28-1965 Admit Type: Outpatient Age: 54 Room: [facility] ENDO ROOM 1 Gender: Male Note Status: Finalized Procedure:             Colonoscopy Indications:           Positive Cologuard test Providers:             Andrey Farmer MD, MD Referring MD:          Tracie Harrier, MD (Referring MD) Medicines:             Monitored Anesthesia Care Complications:         No immediate complications. Procedure:             Pre-Anesthesia Assessment:                        - Prior to the procedure, a History and Physical was                         performed, and patient medications and allergies were                         reviewed. The patient is competent. The risks and                         benefits of the procedure and the sedation options and                         risks were discussed with the patient. All questions                         were answered and informed consent was obtained.                         Patient identification and proposed procedure were                         verified by the physician, the nurse, the anesthetist                         and the technician in the endoscopy suite. Mental                         Status Examination: alert and oriented. Airway                         Examination: normal oropharyngeal airway and neck                         mobility. Respiratory Examination: clear to                         auscultation. CV Examination: normal. Prophylactic                         Antibiotics: The patient does not require prophylactic  antibiotics. Prior Anticoagulants: The patient has                         taken no previous anticoagulant or antiplatelet                         agents. ASA Grade Assessment: III - A patient with                         severe  systemic disease. After reviewing the risks and                         benefits, the patient was deemed in satisfactory                         condition to undergo the procedure. The anesthesia                         plan was to use monitored anesthesia care (MAC).                         Immediately prior to administration of medications,                         the patient was re-assessed for adequacy to receive                         sedatives. The heart rate, respiratory rate, oxygen                         saturations, blood pressure, adequacy of pulmonary                         ventilation, and response to care were monitored                         throughout the procedure. The physical status of the                         patient was re-assessed after the procedure.                        After obtaining informed consent, the colonoscope was                         passed under direct vision. Throughout the procedure,                         the patient's blood pressure, pulse, and oxygen                         saturations were monitored continuously. The                         Colonoscope was introduced through the anus and                         advanced to the the cecum, identified by appendiceal  orifice and ileocecal valve. The colonoscopy was                         performed without difficulty. The patient tolerated                         the procedure well. The quality of the bowel                         preparation was good. Findings:      The perianal and digital rectal examinations were normal.      The colon (entire examined portion) appeared normal.      Non-bleeding internal hemorrhoids were found during retroflexion. The       hemorrhoids were Grade I (internal hemorrhoids that do not prolapse).      The exam was otherwise without abnormality on direct and retroflexion       views. Impression:            - The entire examined colon  is normal.                        - Non-bleeding internal hemorrhoids.                        - The examination was otherwise normal on direct and                         retroflexion views.                        - No specimens collected. Recommendation:        - Discharge patient to home.                        - Resume previous diet.                        - Continue present medications.                        - Repeat colonoscopy in 10 years for screening                         purposes.                        - Return to referring physician as previously                         scheduled. Procedure Code(s):     --- Professional ---                        [phone], Colonoscopy, flexible; diagnostic, including                         collection of specimen(s) by brushing or washing, when                         performed (separate procedure) Diagnosis Code(s):     --- Professional ---  K64.0, First degree hemorrhoids                        R19.5, Other fecal abnormalities CPT copyright 2019 American Medical Association. All rights reserved. The codes documented in this report are preliminary and upon coder review may  be revised to meet current compliance requirements. Andrey Farmer, MD Andrey Farmer MD, MD 11/02/2019 11:09:18 AM Number of Addenda: 0 Note Initiated On: 11/02/2019 10:21 AM Scope Withdrawal Time: 0 hours 5 minutes 49 seconds  Total Procedure Duration: 0 hours 9 minutes 43 seconds  Estimated Blood Loss:  Estimated blood loss: none.      [facility]

## 2019-11-02 NOTE — Anesthesia Preprocedure Evaluation (Signed)
Anesthesia Evaluation  Patient identified by MRN, date of birth, ID band Patient awake    Reviewed: Allergy & Precautions, NPO status , Patient's Chart, lab work & pertinent test results, reviewed documented beta blocker date and time   History of Anesthesia Complications Negative for: history of anesthetic complications  Airway Mallampati: III  TM Distance: >3 FB     Dental  (+) Chipped, Dental Advidsory Given   Pulmonary Current Smoker, former smoker,  Pulmonary HTN and pulmonary fibrosis          Cardiovascular hypertension, + Peripheral Vascular Disease  + dysrhythmias      Neuro/Psych negative neurological ROS  negative psych ROS   GI/Hepatic Neg liver ROS, GERD  ,  Endo/Other  diabetes, Type 2  Renal/GU CRFRenal disease     Musculoskeletal   Abdominal   Peds  Hematology   Anesthesia Other Findings Past Medical History: No date: Chronic kidney disease     Comment:  stage 3 No date: Collagen vascular disease (HCC) No date: Diabetes mellitus without complication (HCC) No date: Dysphagia No date: Dysrhythmia No date: Esophagitis No date: GERD (gastroesophageal reflux disease) No date: Hypertension No date: NSIP (nonspecific interstitial pneumonia) (HCC) No date: Pulmonary fibrosis (HCC) No date: Pulmonary hypertension (HCC) No date: Raynaud disease No date: Raynaud's disease No date: Schatzki's ring No date: Sclerodactyly No date: Scleroderma (HCC)   Reproductive/Obstetrics                             Anesthesia Physical  Anesthesia Plan  ASA: III  Anesthesia Plan: General   Post-op Pain Management:    Induction: Intravenous  PONV Risk Score and Plan: 2 and Propofol infusion and TIVA  Airway Management Planned: Nasal Cannula and Natural Airway  Additional Equipment:   Intra-op Plan:   Post-operative Plan:   Informed Consent: I have reviewed the patients  History and Physical, chart, labs and discussed the procedure including the risks, benefits and alternatives for the proposed anesthesia with the patient or authorized representative who has indicated his/her understanding and acceptance.       Plan Discussed with: CRNA  Anesthesia Plan Comments:         Anesthesia Quick Evaluation

## 2019-11-03 ENCOUNTER — Encounter: Payer: Self-pay | Admitting: Gastroenterology

## 2019-11-03 NOTE — Anesthesia Postprocedure Evaluation (Signed)
Anesthesia Post Note  Patient: Joe Mathews  Procedure(s) Performed: ESOPHAGOGASTRODUODENOSCOPY (EGD) WITH PROPOFOL (N/A ) COLONOSCOPY WITH PROPOFOL (N/A )  Patient location during evaluation: Endoscopy Anesthesia Type: General Level of consciousness: awake and alert and oriented Pain management: pain level controlled Vital Signs Assessment: post-procedure vital signs reviewed and stable Respiratory status: spontaneous breathing Cardiovascular status: blood pressure returned to baseline Anesthetic complications: no   No complications documented.   Last Vitals:  Vitals:   11/02/19 1124 11/02/19 1134  BP:  131/71  Pulse: 67   Resp:    Temp:    SpO2:      Last Pain:  Vitals:   11/02/19 1134  TempSrc:   PainSc: 0-No pain                 Geovanny Sartin

## 2019-11-22 ENCOUNTER — Ambulatory Visit (INDEPENDENT_AMBULATORY_CARE_PROVIDER_SITE_OTHER): Payer: Medicare Other | Admitting: Dermatology

## 2019-11-22 ENCOUNTER — Other Ambulatory Visit: Payer: Self-pay

## 2019-11-22 DIAGNOSIS — L819 Disorder of pigmentation, unspecified: Secondary | ICD-10-CM

## 2019-11-22 DIAGNOSIS — L739 Follicular disorder, unspecified: Secondary | ICD-10-CM | POA: Diagnosis not present

## 2019-11-22 MED ORDER — KETOCONAZOLE 2 % EX SHAM: MEDICATED_SHAMPOO | CUTANEOUS | 3 refills | Status: AC

## 2019-11-22 NOTE — Progress Notes (Signed)
   New Patient Visit  Subjective  Joe Mathews is a 54 y.o. male who presents for the following: Acne (New patient here today for blackheads and acne at back and abdomen, some at face. He has not used any medications to treat. ).  The following portions of the chart were reviewed this encounter and updated as appropriate:  Tobacco  Allergies  Meds  Problems  Med Hx  Surg Hx  Fam Hx     Review of Systems:  No other skin or systemic complaints except as noted in HPI or Assessment and Plan.  Objective  Well appearing patient in no apparent distress; mood and affect are within normal limits.  A focused examination was performed including face, neck, chest and back. Relevant physical exam findings are noted in the Assessment and Plan.  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  Patient advised could take months for spots to fade.  This will likely be a chronic persistent condition but can be better controlled with treatment Consider adding doxycycline 40 or 50mg  daily if not improving  Start ketoconazole 2% shampoo to wash three days a week, let sit a few minutes before rinsing #119ml x 2 for large surface area with 3RF Start [facility] sulfa wash three days a week   Ordered Medications: ketoconazole (NIZORAL) 2 % shampoo  Return in about 3 months (around 02/22/2020) for Acne.  02/24/2020, RMA, am acting as scribe for Anise Salvo, MD . Documentation: I have reviewed the above documentation for accuracy and completeness, and I agree with the above.  Armida Sans, MD

## 2019-11-22 NOTE — Patient Instructions (Signed)
Instructions for [facility] Medications  One or more of your medications was sent to the [facility] mail order compounding pharmacy. You will receive an email from them and can purchase the medicine through that link. It will then be mailed to your home at the address you confirmed. If for any reason you do not receive an email from them, please check your spam folder. If you still do not find the email, please let us know. [facility] phone number is [phone].   

## 2019-11-25 ENCOUNTER — Encounter: Payer: Self-pay | Admitting: Dermatology

## 2020-02-28 ENCOUNTER — Ambulatory Visit: Payer: BC Managed Care – PPO | Admitting: Dermatology

## 2020-09-26 ENCOUNTER — Encounter: Payer: Self-pay | Admitting: Emergency Medicine

## 2020-09-26 ENCOUNTER — Emergency Department
Admission: EM | Admit: 2020-09-26 | Discharge: 2020-09-27 | Disposition: A | Discharge status: Home / Self Care | Payer: Medicare Other | Attending: Emergency Medicine | Admitting: Emergency Medicine

## 2020-09-26 ENCOUNTER — Other Ambulatory Visit: Payer: Self-pay

## 2020-09-26 DIAGNOSIS — I129 Hypertensive chronic kidney disease with stage 1 through stage 4 chronic kidney disease, or unspecified chronic kidney disease: Secondary | ICD-10-CM | POA: Insufficient documentation

## 2020-09-26 DIAGNOSIS — E119 Type 2 diabetes mellitus without complications: Secondary | ICD-10-CM | POA: Insufficient documentation

## 2020-09-26 DIAGNOSIS — Z79899 Other long term (current) drug therapy: Secondary | ICD-10-CM | POA: Diagnosis not present

## 2020-09-26 DIAGNOSIS — R509 Fever, unspecified: Secondary | ICD-10-CM | POA: Diagnosis present

## 2020-09-26 DIAGNOSIS — R0981 Nasal congestion: Secondary | ICD-10-CM | POA: Diagnosis not present

## 2020-09-26 DIAGNOSIS — Z87891 Personal history of nicotine dependence: Secondary | ICD-10-CM | POA: Insufficient documentation

## 2020-09-26 DIAGNOSIS — Z20822 Contact with and (suspected) exposure to covid-19: Secondary | ICD-10-CM | POA: Insufficient documentation

## 2020-09-26 DIAGNOSIS — Z7984 Long term (current) use of oral hypoglycemic drugs: Secondary | ICD-10-CM | POA: Insufficient documentation

## 2020-09-26 DIAGNOSIS — N183 Chronic kidney disease, stage 3 unspecified: Secondary | ICD-10-CM | POA: Insufficient documentation

## 2020-09-26 DIAGNOSIS — Z7982 Long term (current) use of aspirin: Secondary | ICD-10-CM | POA: Diagnosis not present

## 2020-09-26 DIAGNOSIS — N3001 Acute cystitis with hematuria: Secondary | ICD-10-CM | POA: Diagnosis not present

## 2020-09-26 LAB — CBC
HCT: 34.5 % — ABNORMAL LOW (ref 39.0–52.0)
Hemoglobin: 10.7 g/dL — ABNORMAL LOW (ref 13.0–17.0)
MCH: 26 pg (ref 26.0–34.0)
MCHC: 31 g/dL (ref 30.0–36.0)
MCV: 83.7 fL (ref 80.0–100.0)
Platelets: 227 10*3/uL (ref 150–400)
RBC: 4.12 MIL/uL — ABNORMAL LOW (ref 4.22–5.81)
RDW: 14.5 % (ref 11.5–15.5)
WBC: 18.8 10*3/uL — ABNORMAL HIGH (ref 4.0–10.5)
nRBC: 0 % (ref 0.0–0.2)

## 2020-09-26 LAB — URINALYSIS, COMPLETE (UACMP) WITH MICROSCOPIC
Bilirubin Urine: NEGATIVE
Glucose, UA: NEGATIVE mg/dL
Ketones, ur: NEGATIVE mg/dL
Nitrite: NEGATIVE
Protein, ur: >300 mg/dL — AB
Specific Gravity, Urine: 1.015 (ref 1.005–1.030)
Squamous Epithelial / HPF: NONE SEEN (ref 0–5)
WBC, UA: >50 WBC/hpf (ref 0–5)
pH: 5.5 (ref 5.0–8.0)

## 2020-09-26 LAB — BASIC METABOLIC PANEL
Anion gap: 10 (ref 5–15)
BUN: 28 mg/dL — ABNORMAL HIGH (ref 6–20)
CO2: 26 mmol/L (ref 22–32)
Calcium: 8.4 mg/dL — ABNORMAL LOW (ref 8.9–10.3)
Chloride: 99 mmol/L (ref 98–111)
Creatinine, Ser: 2.23 mg/dL — ABNORMAL HIGH (ref 0.61–1.24)
GFR, Estimated: 34 mL/min — ABNORMAL LOW (ref >60)
Glucose, Bld: 182 mg/dL — ABNORMAL HIGH (ref 70–99)
Potassium: 3.7 mmol/L (ref 3.5–5.1)
Sodium: 135 mmol/L (ref 135–145)

## 2020-09-26 LAB — RESP PANEL BY RT-PCR (FLU A&B, COVID) ARPGX2
Influenza A by PCR: NEGATIVE
Influenza B by PCR: NEGATIVE
SARS Coronavirus 2 by RT PCR: NEGATIVE

## 2020-09-26 MED ORDER — ACETAMINOPHEN 500 MG PO TABS
1000.0000 mg | ORAL_TABLET | Freq: Once | ORAL | Status: AC
  Administered 2020-09-26: 1000 mg via ORAL
  Filled 2020-09-26: qty 2

## 2020-09-26 MED ORDER — CEFTRIAXONE SODIUM 1 G IJ SOLR
1.0000 g | Freq: Once | INTRAMUSCULAR | Status: AC
  Administered 2020-09-27: 1 g via INTRAMUSCULAR
  Filled 2020-09-26: qty 10

## 2020-09-26 NOTE — ED Triage Notes (Signed)
Fever x 1 day.  Tylenol brings temp down, but it returns.  Last medicated with tylenol today at 1530.  AAOx3.  Skin warm and dry. NAD

## 2020-09-26 NOTE — ED Provider Notes (Signed)
[facility] EMERGENCY DEPARTMENT Provider Note   CSN: [id number] Arrival date & time: 09/26/20  1736     History Chief Complaint  Patient presents with   Fever    Joe Mathews is a 55 y.o. male presents to the emergency department for evaluation of fever x1 day.  Patient states he woke up last night and had a high fever with chills.  He has noticed some burning with urination.  No abdominal pain, back pain, nausea or vomiting.  No cough chest pain shortness of breath.  Has had some mild congestion.  Had a negative COVID test today at walk-in clinic.  Denies any risk for STD, no penile discharge.  He has not had any medications for fevers today.  HPI     Past Medical History:  Diagnosis Date   Chronic kidney disease    stage 3   Collagen vascular disease (HCC)    Diabetes mellitus without complication (HCC)    Dysphagia    Dysrhythmia    Esophagitis    GERD (gastroesophageal reflux disease)    Hyperlipidemia    Hypertension    NSIP (nonspecific interstitial pneumonia) (HCC)    NSIP (nonspecific interstitial pneumonia) (HCC)    Pulmonary fibrosis (HCC)    Pulmonary hypertension (HCC)    Raynaud disease    Raynaud's disease    Schatzki's ring    Sclerodactyly    Scleroderma (HCC)     There are no problems to display for this patient.   Past Surgical History:  Procedure Laterality Date   COLONOSCOPY WITH PROPOFOL N/A 07/14/2015   Procedure: COLONOSCOPY WITH PROPOFOL;  Surgeon: Scot Jun, MD;  Location: [facility] ENDOSCOPY;  Service: Endoscopy;  Laterality: N/A;   COLONOSCOPY WITH PROPOFOL N/A 11/02/2019   Procedure: COLONOSCOPY WITH PROPOFOL;  Surgeon: Regis Bill, MD;  Location: [facility] ENDOSCOPY;  Service: Endoscopy;  Laterality: N/A;   ESOPHAGOGASTRODUODENOSCOPY (EGD) WITH PROPOFOL N/A 04/28/2015   Procedure: ESOPHAGOGASTRODUODENOSCOPY (EGD) WITH PROPOFOL;  Surgeon: Elnita Maxwell, MD;  Location: [facility] ENDOSCOPY;  Service:  Endoscopy;  Laterality: N/A;   ESOPHAGOGASTRODUODENOSCOPY (EGD) WITH PROPOFOL N/A 07/14/2015   Procedure: ESOPHAGOGASTRODUODENOSCOPY (EGD) WITH PROPOFOL;  Surgeon: Scot Jun, MD;  Location: [facility] ENDOSCOPY;  Service: Endoscopy;  Laterality: N/A;   ESOPHAGOGASTRODUODENOSCOPY (EGD) WITH PROPOFOL N/A 01/29/2016   Procedure: ESOPHAGOGASTRODUODENOSCOPY (EGD) WITH PROPOFOL;  Surgeon: Scot Jun, MD;  Location: [facility] ENDOSCOPY;  Service: Endoscopy;  Laterality: N/A;   ESOPHAGOGASTRODUODENOSCOPY (EGD) WITH PROPOFOL N/A 12/29/2017   Procedure: ESOPHAGOGASTRODUODENOSCOPY (EGD) WITH PROPOFOL;  Surgeon: Scot Jun, MD;  Location: [facility] ENDOSCOPY;  Service: Endoscopy;  Laterality: N/A;   ESOPHAGOGASTRODUODENOSCOPY (EGD) WITH PROPOFOL N/A 03/03/2019   Procedure: ESOPHAGOGASTRODUODENOSCOPY (EGD) WITH PROPOFOL;  Surgeon: Toledo, Boykin Nearing, MD;  Location: [facility] ENDOSCOPY;  Service: Gastroenterology;  Laterality: N/A;   ESOPHAGOGASTRODUODENOSCOPY (EGD) WITH PROPOFOL N/A 11/02/2019   Procedure: ESOPHAGOGASTRODUODENOSCOPY (EGD) WITH PROPOFOL;  Surgeon: Regis Bill, MD;  Location: [facility] ENDOSCOPY;  Service: Endoscopy;  Laterality: N/A;   LUNG BIOPSY     RENAL BIOPSY     SAVORY DILATION N/A 01/29/2016   Procedure: SAVORY DILATION;  Surgeon: Scot Jun, MD;  Location: [facility] ENDOSCOPY;  Service: Endoscopy;  Laterality: N/A;   UPPER GI ENDOSCOPY         Family History  Problem Relation Age of Onset   Cancer Mother    Diabetes Mellitus II Mother    Diabetes Mother    Diabetes Mellitus II Father    Diabetes Father  Glaucoma Paternal Aunt    Diabetes Paternal Aunt    Diabetes Paternal Grandmother    Diabetes Paternal Grandfather     Social History   Tobacco Use   Smoking status: Former    Packs/day: 0.25    Years: 28.00    Pack years: 7.00    Types: Cigarettes    Quit date: 08/19/2018    Years since quitting: 2.1   Smokeless tobacco: Former    Quit date: 07/13/1985  Vaping Use    Vaping Use: Never used  Substance Use Topics   Alcohol use: Yes    Comment: 2-3 drinks    Drug use: No    Home Medications Prior to Admission medications   Medication Sig Start Date End Date Taking? Authorizing Provider  cephALEXin (KEFLEX) 500 MG capsule Take 1 capsule (500 mg total) by mouth 4 (four) times daily for 10 days. 09/27/20 10/07/20 Yes Amador Cunas C, PA-C  ADCIRCA 20 MG TABS Take 1 tablet by mouth 2 (two) times daily. 06/12/15   [provider]  aspirin EC 81 MG tablet Take 81 mg by mouth daily.    [provider]  cloNIDine (CATAPRES - DOSED IN MG/24 HR) 0.2 mg/24hr patch Place 0.2 mg onto the skin once a week.    [provider]  doxazosin (CARDURA) 4 MG tablet Take 4 mg by mouth daily.    [provider]  famotidine (PEPCID) 40 MG tablet Take 40 mg by mouth daily.    [provider]  furosemide (LASIX) 40 MG tablet Take 40 mg by mouth daily as needed.     [provider]  glimepiride (AMARYL) 4 MG tablet Take 4 mg by mouth daily with breakfast.    [provider]  ketoconazole (NIZORAL) 2 % shampoo Use as body wash three times weekly, let sit a few minutes before rinsing. 11/22/19   Deirdre Evener, MD  linagliptin (TRADJENTA) 5 MG TABS tablet Take 5 mg by mouth daily.    [provider]  losartan (COZAAR) 100 MG tablet Take 100 mg by mouth daily.    [provider]  Macitentan (OPSUMIT) 10 MG TABS Take 10 mg by mouth daily.    [provider]  meloxicam (MOBIC) 7.5 MG tablet Take 7.5 mg by mouth daily.    [provider]  metFORMIN (GLUCOPHAGE) 1000 MG tablet Take 1,000 mg by mouth 2 (two) times daily with a meal.    [provider]  mycophenolate (CELLCEPT) 500 MG tablet Take 1,500 mg by mouth 2 (two) times daily.    [provider]  NIFEdipine (ADALAT CC) 90 MG 24 hr tablet Take 90 mg by mouth daily.    [provider]  OXYGEN Inhale 2 L/min  into the lungs continuous.    [provider]  potassium chloride (MICRO-K) 10 MEQ CR capsule Take by mouth. 06/30/17   [provider]  TADALAFIL PO Take by mouth daily.    [provider]  valsartan (DIOVAN) 320 MG tablet Take 320 mg by mouth daily.    [provider]    Allergies    Omeprazole and Protonix [pantoprazole sodium]  Review of Systems   Review of Systems  Constitutional:  Positive for chills and fever.  Respiratory:  Negative for shortness of breath.   Cardiovascular:  Negative for chest pain.  Gastrointestinal:  Negative for diarrhea, nausea and vomiting.  Genitourinary:  Positive for dysuria and frequency. Negative for penile discharge and penile pain.  Musculoskeletal:  Negative for arthralgias.  Skin:  Negative for rash.   Physical Exam Updated Vital Signs BP (!) 122/47   Pulse 76   Temp 99.3 F (37.4 C) (Oral)   Resp 20   Ht 6\' 2"  (1.88 m)   Wt 117.9 kg   SpO2 94%   BMI 33.37 kg/m   Physical Exam Constitutional:      Appearance: He is well-developed.  HENT:     Head: Normocephalic and atraumatic.     Nose: No congestion.     Mouth/Throat:     Pharynx: No oropharyngeal exudate or posterior oropharyngeal erythema.  Eyes:     Conjunctiva/sclera: Conjunctivae normal.  Cardiovascular:     Rate and Rhythm: Normal rate.  Pulmonary:     Effort: Pulmonary effort is normal. No respiratory distress.     Breath sounds: Normal breath sounds.  Abdominal:     General: There is no distension.     Palpations: Abdomen is soft.     Tenderness: There is no abdominal tenderness. There is no right CVA tenderness, left CVA tenderness or guarding.  Musculoskeletal:        General: Normal range of motion.     Cervical back: Normal range of motion.  Skin:    General: Skin is warm.     Findings: No rash.  Neurological:     Mental Status: He is alert and oriented to person, place, and time.  Psychiatric:        Behavior: Behavior  normal.        Thought Content: Thought content normal.    ED Results / Procedures / Treatments   Labs (all labs ordered are listed, but only abnormal results are displayed) Labs Reviewed  URINALYSIS, COMPLETE (UACMP) WITH MICROSCOPIC - Abnormal; Notable for the following components:      Result Value   APPearance HAZY (*)    Hgb urine dipstick LARGE (*)    Protein, ur >300 (*)    Leukocytes,Ua MODERATE (*)    Bacteria, UA RARE (*)    All other components within normal limits  CBC - Abnormal; Notable for the following components:   WBC 18.8 (*)    RBC 4.12 (*)    Hemoglobin 10.7 (*)    HCT 34.5 (*)    All other components within normal limits  BASIC METABOLIC PANEL - Abnormal; Notable for the following components:   Glucose, Bld 182 (*)    BUN 28 (*)    Creatinine, Ser 2.23 (*)    Calcium 8.4 (*)    GFR, Estimated 34 (*)    All other components within normal limits  RESP PANEL BY RT-PCR (FLU A&B, COVID) ARPGX2  URINE CULTURE    EKG None  Radiology No results found.  Procedures Procedures   Medications Ordered in ED Medications  acetaminophen (TYLENOL) tablet 1,000 mg (1,000 mg Oral Given 09/26/20 2235)  cefTRIAXone (ROCEPHIN) injection 1 g (1 g Intramuscular Given 09/27/20 0003)    ED Course  I have reviewed the triage vital signs and the nursing notes.  Pertinent labs & imaging results that were available during my care of the patient were reviewed by me and considered in my medical decision making (see chart for details).    MDM Rules/Calculators/A&P                         55 year old male reports fever last night of 104 with a thermal thermometer.  Also noticed some  chills.  Today noted to have dysuria and increase in urinary frequency.  Has not taken any antipyretic medications and patient with low-grade temp of 99.  He has not had any complaints of abdominal pain nausea vomiting or back pain.  Vital signs are stable, normal blood pressure nontachycardic.   He does have a elevated white count but remainder of labs remain normal, kidney function at baseline.  Patient appears well in no distress.  Patient given IM ceftriaxone and will be discharged with p.o. cephalexin.  He understands signs symptoms return to the ER for. Final Clinical Impression(s) / ED Diagnoses Final diagnoses:  Acute cystitis with hematuria    Rx / DC Orders ED Discharge Orders          Ordered    cephALEXin (KEFLEX) 500 MG capsule  4 times daily        09/27/20 0034             Evon Slack, PA-C 09/27/20 0038    Sharman Cheek, MD 09/30/20 1730

## 2020-09-27 MED ORDER — CEPHALEXIN 500 MG PO CAPS: 500.0000 mg | ORAL_CAPSULE | Freq: Four times a day (QID) | ORAL | 0 refills | Status: AC

## 2020-09-27 NOTE — Discharge Instructions (Addendum)
Please take antibiotics as prescribed.  Make sure you are drinking lots of fluids.  If any fevers above 101 that are not going down with Tylenol or ibuprofen please return to the ER.  Also return to the ER for any belly pain back pain nausea vomiting.

## 2020-09-29 LAB — URINE CULTURE: Culture: >=100000 — AB

## 2020-12-18 ENCOUNTER — Encounter
Admission: RE | Disposition: A | Discharge status: Home / Self Care | Payer: Self-pay | Source: Home / Self Care | Attending: Gastroenterology

## 2020-12-18 ENCOUNTER — Encounter: Payer: Self-pay | Admitting: Anesthesiology

## 2020-12-18 ENCOUNTER — Ambulatory Visit
Admission: RE | Admit: 2020-12-18 | Discharge: 2020-12-18 | Disposition: A | Discharge status: Home / Self Care | Payer: Medicare Other | Attending: Gastroenterology | Admitting: Gastroenterology

## 2020-12-18 ENCOUNTER — Ambulatory Visit: Payer: Medicare Other | Admitting: Anesthesiology

## 2020-12-18 DIAGNOSIS — D759 Disease of blood and blood-forming organs, unspecified: Secondary | ICD-10-CM | POA: Diagnosis not present

## 2020-12-18 DIAGNOSIS — Z87891 Personal history of nicotine dependence: Secondary | ICD-10-CM | POA: Insufficient documentation

## 2020-12-18 DIAGNOSIS — M3481 Systemic sclerosis with lung involvement: Secondary | ICD-10-CM | POA: Diagnosis not present

## 2020-12-18 DIAGNOSIS — N183 Chronic kidney disease, stage 3 unspecified: Secondary | ICD-10-CM | POA: Insufficient documentation

## 2020-12-18 DIAGNOSIS — R1310 Dysphagia, unspecified: Secondary | ICD-10-CM | POA: Insufficient documentation

## 2020-12-18 DIAGNOSIS — K219 Gastro-esophageal reflux disease without esophagitis: Secondary | ICD-10-CM | POA: Insufficient documentation

## 2020-12-18 DIAGNOSIS — K2289 Other specified disease of esophagus: Secondary | ICD-10-CM | POA: Insufficient documentation

## 2020-12-18 DIAGNOSIS — I129 Hypertensive chronic kidney disease with stage 1 through stage 4 chronic kidney disease, or unspecified chronic kidney disease: Secondary | ICD-10-CM | POA: Insufficient documentation

## 2020-12-18 DIAGNOSIS — E1122 Type 2 diabetes mellitus with diabetic chronic kidney disease: Secondary | ICD-10-CM | POA: Diagnosis not present

## 2020-12-18 DIAGNOSIS — D649 Anemia, unspecified: Secondary | ICD-10-CM | POA: Diagnosis not present

## 2020-12-18 HISTORY — DX: Anemia, unspecified: D64.9

## 2020-12-18 HISTORY — PX: ESOPHAGOGASTRODUODENOSCOPY (EGD) WITH PROPOFOL: SHX5813

## 2020-12-18 LAB — KOH PREP: KOH Prep: NONE SEEN

## 2020-12-18 LAB — GLUCOSE, CAPILLARY: Glucose-Capillary: 101 mg/dL — ABNORMAL HIGH (ref 70–99)

## 2020-12-18 SURGERY — ESOPHAGOGASTRODUODENOSCOPY (EGD) WITH PROPOFOL
Anesthesia: General

## 2020-12-18 MED ORDER — PROPOFOL 500 MG/50ML IV EMUL
INTRAVENOUS | Status: AC
  Filled 2020-12-18: qty 50

## 2020-12-18 MED ORDER — SODIUM CHLORIDE 0.9 % IV SOLN
INTRAVENOUS | Status: DC
  Administered 2020-12-18: 20 mL/h via INTRAVENOUS

## 2020-12-18 MED ORDER — LIDOCAINE HCL (CARDIAC) PF 100 MG/5ML IV SOSY
PREFILLED_SYRINGE | INTRAVENOUS | Status: DC | PRN
  Administered 2020-12-18: 100 mg via INTRAVENOUS

## 2020-12-18 MED ORDER — LIDOCAINE HCL (PF) 2 % IJ SOLN
INTRAMUSCULAR | Status: AC
  Filled 2020-12-18: qty 5

## 2020-12-18 MED ORDER — PROPOFOL 500 MG/50ML IV EMUL
INTRAVENOUS | Status: DC | PRN
  Administered 2020-12-18: 150 ug/kg/min via INTRAVENOUS

## 2020-12-18 NOTE — Anesthesia Postprocedure Evaluation (Signed)
Anesthesia Post Note  Patient: Joe Mathews  Procedure(s) Performed: ESOPHAGOGASTRODUODENOSCOPY (EGD) WITH PROPOFOL  Patient location during evaluation: PACU Anesthesia Type: General Level of consciousness: awake and alert Pain management: pain level controlled Vital Signs Assessment: post-procedure vital signs reviewed and stable Respiratory status: spontaneous breathing, nonlabored ventilation, respiratory function stable and patient connected to nasal cannula oxygen Cardiovascular status: blood pressure returned to baseline and stable Postop Assessment: no apparent nausea or vomiting Anesthetic complications: no   No notable events documented.   Last Vitals:  Vitals:   12/18/20 0938 12/18/20 0947  BP: (!) 145/71 118/70  Pulse: 99 87  Resp: 17 17  Temp:    SpO2: 99% 99%    Last Pain:  Vitals:   12/18/20 0947  TempSrc:   PainSc: 0-No pain                 Yevette Edwards

## 2020-12-18 NOTE — Anesthesia Preprocedure Evaluation (Signed)
Anesthesia Evaluation  Patient identified by MRN, date of birth, ID band Patient awake    Reviewed: Allergy & Precautions, H&P , NPO status , Patient's Chart, lab work & pertinent test results, reviewed documented beta blocker date and time   Airway Mallampati: II   Neck ROM: full    Dental  (+) Poor Dentition   Pulmonary pneumonia, resolved, former smoker,    Pulmonary exam normal        Cardiovascular Exercise Tolerance: Good hypertension, On Medications Normal cardiovascular exam+ dysrhythmias  Rhythm:regular Rate:Normal     Neuro/Psych negative neurological ROS  negative psych ROS   GI/Hepatic Neg liver ROS, GERD  Medicated,  Endo/Other  negative endocrine ROSdiabetes, Well Controlled, Type 2, Oral Hypoglycemic Agents  Renal/GU CRFRenal disease  negative genitourinary   Musculoskeletal   Abdominal   Peds  Hematology  (+) Blood dyscrasia, anemia ,   Anesthesia Other Findings Past Medical History: No date: Anemia No date: Chronic kidney disease     Comment:  stage 3 No date: Collagen vascular disease (HCC) No date: Diabetes mellitus without complication (HCC) No date: Dysphagia No date: Dysrhythmia No date: Esophagitis No date: GERD (gastroesophageal reflux disease) No date: Hyperlipidemia No date: Hypertension No date: NSIP (nonspecific interstitial pneumonia) (HCC) No date: NSIP (nonspecific interstitial pneumonia) (HCC) No date: Pulmonary fibrosis (HCC) No date: Pulmonary hypertension (HCC) No date: Raynaud disease No date: Raynaud's disease No date: Schatzki's ring No date: Sclerodactyly No date: Scleroderma (HCC) Past Surgical History: 07/14/2015: COLONOSCOPY WITH PROPOFOL; N/A     Comment:  Procedure: COLONOSCOPY WITH PROPOFOL;  Surgeon: Scot Jun, MD;  Location: [facility] ENDOSCOPY;  Service:               Endoscopy;  Laterality: N/A; 11/02/2019: COLONOSCOPY WITH PROPOFOL; N/A      Comment:  Procedure: COLONOSCOPY WITH PROPOFOL;  Surgeon:               Regis Bill, MD;  Location: [facility] ENDOSCOPY;                Service: Endoscopy;  Laterality: N/A; 04/28/2015: ESOPHAGOGASTRODUODENOSCOPY (EGD) WITH PROPOFOL; N/A     Comment:  Procedure: ESOPHAGOGASTRODUODENOSCOPY (EGD) WITH               PROPOFOL;  Surgeon: Elnita Maxwell, MD;  Location:               [facility] ENDOSCOPY;  Service: Endoscopy;  Laterality: N/A; 07/14/2015: ESOPHAGOGASTRODUODENOSCOPY (EGD) WITH PROPOFOL; N/A     Comment:  Procedure: ESOPHAGOGASTRODUODENOSCOPY (EGD) WITH               PROPOFOL;  Surgeon: Scot Jun, MD;  Location: [facility]              ENDOSCOPY;  Service: Endoscopy;  Laterality: N/A; 01/29/2016: ESOPHAGOGASTRODUODENOSCOPY (EGD) WITH PROPOFOL; N/A     Comment:  Procedure: ESOPHAGOGASTRODUODENOSCOPY (EGD) WITH               PROPOFOL;  Surgeon: Scot Jun, MD;  Location: [facility]              ENDOSCOPY;  Service: Endoscopy;  Laterality: N/A; 12/29/2017: ESOPHAGOGASTRODUODENOSCOPY (EGD) WITH PROPOFOL; N/A     Comment:  Procedure: ESOPHAGOGASTRODUODENOSCOPY (EGD) WITH               PROPOFOL;  Surgeon: Scot Jun, MD;  Location:  [facility] ENDOSCOPY;  Service: Endoscopy;  Laterality: N/A; 03/03/2019: ESOPHAGOGASTRODUODENOSCOPY (EGD) WITH PROPOFOL; N/A     Comment:  Procedure: ESOPHAGOGASTRODUODENOSCOPY (EGD) WITH               PROPOFOL;  Surgeon: Toledo, Benay Pike, MD;  Location:               [facility] ENDOSCOPY;  Service: Gastroenterology;  Laterality:               N/A; 11/02/2019: ESOPHAGOGASTRODUODENOSCOPY (EGD) WITH PROPOFOL; N/A     Comment:  Procedure: ESOPHAGOGASTRODUODENOSCOPY (EGD) WITH               PROPOFOL;  Surgeon: Lesly Rubenstein, MD;  Location:               [facility] ENDOSCOPY;  Service: Endoscopy;  Laterality: N/A; No date: LUNG BIOPSY No date: RENAL BIOPSY 01/29/2016: SAVORY DILATION; N/A     Comment:  Procedure: SAVORY DILATION;  Surgeon: Manya Silvas,               MD;  Location: [facility] ENDOSCOPY;  Service: Endoscopy;                Laterality: N/A; No date: UPPER GI ENDOSCOPY BMI    Body Mass Index: 31.46 kg/m     Reproductive/Obstetrics negative OB ROS                             Anesthesia Physical Anesthesia Plan  ASA: 3  Anesthesia Plan: General   Post-op Pain Management:    Induction:   PONV Risk Score and Plan:   Airway Management Planned:   Additional Equipment:   Intra-op Plan:   Post-operative Plan:   Informed Consent: I have reviewed the patients History and Physical, chart, labs and discussed the procedure including the risks, benefits and alternatives for the proposed anesthesia with the patient or authorized representative who has indicated his/her understanding and acceptance.     Dental Advisory Given  Plan Discussed with: CRNA  Anesthesia Plan Comments:         Anesthesia Quick Evaluation

## 2020-12-18 NOTE — Brief Op Note (Signed)
Brushing for esophageal Candida sent to lab to evaluate for Candida

## 2020-12-18 NOTE — Transfer of Care (Signed)
Immediate Anesthesia Transfer of Care Note  Patient: Joe Mathews  Procedure(s) Performed: ESOPHAGOGASTRODUODENOSCOPY (EGD) WITH PROPOFOL  Patient Location: PACU  Anesthesia Type:General  Level of Consciousness: awake and sedated  Airway & Oxygen Therapy: Patient Spontanous Breathing and Patient connected to face mask oxygen  Post-op Assessment: Report given to RN and Post -op Vital signs reviewed and stable  Post vital signs: Reviewed and stable  Last Vitals:  Vitals Value Taken Time  BP    Temp    Pulse    Resp    SpO2      Last Pain:  Vitals:   12/18/20 0803  TempSrc: Temporal  PainSc: 0-No pain         Complications: No notable events documented.

## 2020-12-18 NOTE — H&P (Signed)
Outpatient short stay form Pre-procedure 12/18/2020  Regis Bill, MD  Primary Physician: Barbette Reichmann, MD  Reason for visit:  Dysphagia  History of present illness:   55 y/o gentleman with history of scleroderma with pulmonary involvement here for EGD for dysphagia. 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 Is now having dysphagia to liquids and solids. We dilated him to 16.5 mm last time which was a little over a year ago. No blood thinners.    Current Facility-Administered Medications:    0.9 %  sodium chloride infusion, , Intravenous, Continuous, Seamus Warehime, Rossie Muskrat, MD, Last Rate: 20 mL/hr at 12/18/20 0845, Continued from Pre-op at 12/18/20 0845  Medications Prior to Admission  Medication Sig Dispense Refill Last Dose   ADCIRCA 20 MG TABS Take 1 tablet by mouth 2 (two) times daily.   12/17/2020   aspirin EC 81 MG tablet Take 81 mg by mouth daily.   12/17/2020   cloNIDine (CATAPRES - DOSED IN MG/24 HR) 0.2 mg/24hr patch Place 0.2 mg onto the skin once a week.   12/17/2020   doxazosin (CARDURA) 4 MG tablet Take 4 mg by mouth daily.   12/17/2020   famotidine (PEPCID) 40 MG tablet Take 40 mg by mouth daily.   12/17/2020   furosemide (LASIX) 40 MG tablet Take 40 mg by mouth daily as needed.    12/17/2020   glimepiride (AMARYL) 4 MG tablet Take 4 mg by mouth daily with breakfast.   12/17/2020   ketoconazole (NIZORAL) 2 % shampoo Use as body wash three times weekly, let sit a few minutes before rinsing. 240 mL 3 12/17/2020   linagliptin (TRADJENTA) 5 MG TABS tablet Take 5 mg by mouth daily.   12/17/2020   losartan (COZAAR) 100 MG tablet Take 100 mg by mouth daily.   12/18/2020 at 0715   macitentan (OPSUMIT) 10 MG tablet Take 10 mg by mouth daily.   12/17/2020   meloxicam (MOBIC) 7.5 MG tablet Take 7.5 mg by mouth daily.   12/17/2020   metFORMIN (GLUCOPHAGE) 1000 MG tablet Take 1,000 mg by mouth 2 (two) times daily with a meal.   12/17/2020   mycophenolate  (CELLCEPT) 500 MG tablet Take 1,500 mg by mouth 2 (two) times daily.   12/17/2020   NIFEdipine (ADALAT CC) 90 MG 24 hr tablet Take 90 mg by mouth daily.   12/18/2020 at 0715   OXYGEN Inhale 2 L/min into the lungs continuous.   12/17/2020   potassium chloride (MICRO-K) 10 MEQ CR capsule Take by mouth.   12/17/2020   TADALAFIL PO Take by mouth daily.   12/17/2020   valsartan (DIOVAN) 320 MG tablet Take 320 mg by mouth daily.   12/17/2020     Allergies  Allergen Reactions   Omeprazole    Protonix [Pantoprazole Sodium]      Past Medical History:  Diagnosis Date   Anemia    Chronic kidney disease    stage 3   Collagen vascular disease (HCC)    Diabetes mellitus without complication (HCC)    Dysphagia    Dysrhythmia    Esophagitis    GERD (gastroesophageal reflux disease)    Hyperlipidemia    Hypertension    NSIP (nonspecific interstitial pneumonia) (HCC)    NSIP (nonspecific interstitial pneumonia) (HCC)    Pulmonary fibrosis (HCC)    Pulmonary hypertension (HCC)    Raynaud disease    Raynaud's disease    Schatzki's ring    Sclerodactyly    Scleroderma (HCC)  Review of systems:  Otherwise negative.    Physical Exam  Gen: Alert, oriented. Appears stated age.  HEENT: PERRLA. Lungs: No respiratory distress CV: RRR Abd: soft, benign, no masses Ext: No edema    Planned procedures: Proceed with EGD. The patient understands the nature of the planned procedure, indications, risks, alternatives and potential complications including but not limited to bleeding, infection, perforation, damage to internal organs and possible oversedation/side effects from anesthesia. The patient agrees and gives consent to proceed.  Please refer to procedure notes for findings, recommendations and patient disposition/instructions.     Lesly Rubenstein, MD [facility]

## 2020-12-18 NOTE — Op Note (Signed)
[facility] Patient Name: Joe Mathews Procedure Date: 12/18/2020 9:10 AM MRN: [id number] Account #: [id number] Date of Birth: 07-23-1965 Admit Type: Outpatient Age: 55 Room: [facility] ENDO ROOM 3 Gender: Male Note Status: Finalized Instrument Name: Upper Endoscope [id number] Procedure:             Upper GI endoscopy Indications:           Dysphagia Providers:             Andrey Farmer MD, MD Referring MD:          Tracie Harrier, MD (Referring MD) Medicines:             Monitored Anesthesia Care Complications:         No immediate complications. Estimated blood loss:                         Minimal. Procedure:             Pre-Anesthesia Assessment:                        - Prior to the procedure, a History and Physical was                         performed, and patient medications and allergies were                         reviewed. The patient is competent. The risks and                         benefits of the procedure and the sedation options and                         risks were discussed with the patient. All questions                         were answered and informed consent was obtained.                         Patient identification and proposed procedure were                         verified by the physician, the nurse, the anesthetist                         and the technician in the endoscopy suite. Mental                         Status Examination: alert and oriented. Respiratory                         Examination: clear to auscultation. CV Examination:                         normal. Prophylactic Antibiotics: The patient does not                         require prophylactic antibiotics. Prior  Anticoagulants: The patient has taken no previous                         anticoagulant or antiplatelet agents. ASA Grade                         Assessment: III - A patient with severe systemic                          disease. After reviewing the risks and benefits, the                         patient was deemed in satisfactory condition to                         undergo the procedure. The anesthesia plan was to use                         monitored anesthesia care (MAC). Immediately prior to                         administration of medications, the patient was                         re-assessed for adequacy to receive sedatives. The                         heart rate, respiratory rate, oxygen saturations,                         blood pressure, adequacy of pulmonary ventilation, and                         response to care were monitored throughout the                         procedure. The physical status of the patient was                         re-assessed after the procedure.                        After obtaining informed consent, the endoscope was                         passed under direct vision. Throughout the procedure,                         the patient's blood pressure, pulse, and oxygen                         saturations were monitored continuously. The Endoscope                         was introduced through the mouth, and advanced to the                         second part of duodenum. The upper GI endoscopy was  accomplished without difficulty. The patient tolerated                         the procedure well. Findings:      White nummular lesions were noted in the upper third of the esophagus.       Brushings for KOH prep were obtained. Estimated blood loss: none.      Four benign-appearing, intrinsic mild (non-circumferential scarring)       stenoses were found. The narrowest stenosis measured 1.5 cm (inner       diameter) x less than one cm (in length). The stenoses were traversed. A       TTS dilator was passed through the scope. Dilation with a 15-16.5-18 mm       balloon dilator was performed to 18 mm. The dilation site was examined       and showed  moderate mucosal disruption. Estimated blood loss was minimal.      The entire examined stomach was normal.      The examined duodenum was normal. Impression:            - White nummular lesions in esophageal mucosa.                         Brushings performed.                        - Benign-appearing esophageal stenoses. Dilated.                        - Normal stomach.                        - Normal examined duodenum. Recommendation:        - Discharge patient to home.                        - Resume previous diet.                        - Continue present medications.                        - Await pathology results.                        - Return to referring physician as previously                         scheduled. Procedure Code(s):     --- Professional ---                        [phone], Esophagogastroduodenoscopy, flexible,                         transoral; with transendoscopic balloon dilation of                         esophagus (less than 30 mm diameter) Diagnosis Code(s):     --- Professional ---                        K22.8, Other specified diseases of esophagus  K22.2, Esophageal obstruction                        R13.10, Dysphagia, unspecified CPT copyright 2019 American Medical Association. All rights reserved. The codes documented in this report are preliminary and upon coder review may  be revised to meet current compliance requirements. Andrey Farmer MD, MD 12/18/2020 9:32:00 AM Number of Addenda: 0 Note Initiated On: 12/18/2020 9:10 AM Estimated Blood Loss:  Estimated blood loss was minimal.      [facility]

## 2020-12-18 NOTE — Anesthesia Procedure Notes (Signed)
Date/Time: 12/18/2020 9:26 AM Performed by: Tonia Ghent Pre-anesthesia Checklist: Patient identified, Emergency Drugs available, Suction available, Patient being monitored and Timeout performed Patient Re-evaluated:Patient Re-evaluated prior to induction Oxygen Delivery Method: Non-rebreather mask Preoxygenation: Pre-oxygenation with 100% oxygen Induction Type: IV induction Airway Equipment and Method: Bite block Placement Confirmation: positive ETCO2 and CO2 detector

## 2020-12-18 NOTE — Interval H&P Note (Signed)
History and Physical Interval Note:  12/18/2020 9:07 AM  Joe Mathews  has presented today for surgery, with the diagnosis of DYSPHAGIA.  The various methods of treatment have been discussed with the patient and family. After consideration of risks, benefits and other options for treatment, the patient has consented to  Procedure(s) with comments: ESOPHAGOGASTRODUODENOSCOPY (EGD) WITH PROPOFOL (N/A) - DM as a surgical intervention.  The patient's history has been reviewed, patient examined, no change in status, stable for surgery.  I have reviewed the patient's chart and labs.  Questions were answered to the patient's satisfaction.     Regis Bill  Ok to proceed with EGD

## 2020-12-19 ENCOUNTER — Encounter: Payer: Self-pay | Admitting: Gastroenterology

## 2022-11-29 ENCOUNTER — Other Ambulatory Visit
Admission: RE | Admit: 2022-11-29 | Discharge: 2022-11-29 | Disposition: A | Discharge status: Home / Self Care | Payer: Medicare Other | Attending: Urology | Admitting: Urology

## 2022-11-29 ENCOUNTER — Ambulatory Visit: Payer: Medicare Other | Admitting: Urology

## 2022-11-29 VITALS — BP 170/91 | HR 76 | Ht 73.5 in | Wt 246.1 lb

## 2022-11-29 DIAGNOSIS — R972 Elevated prostate specific antigen [PSA]: Secondary | ICD-10-CM | POA: Insufficient documentation

## 2022-11-29 DIAGNOSIS — R351 Nocturia: Secondary | ICD-10-CM

## 2022-11-29 DIAGNOSIS — N401 Enlarged prostate with lower urinary tract symptoms: Secondary | ICD-10-CM

## 2022-11-29 LAB — PSA: Prostatic Specific Antigen: 3.4 ng/mL (ref 0.00–4.00)

## 2022-11-29 NOTE — Progress Notes (Signed)
Marcelle Overlie Plume,acting as a scribe for Vanna Scotland, MD.,have documented all relevant documentation on the behalf of Vanna Scotland, MD,as directed by  Vanna Scotland, MD while in the presence of Vanna Scotland, MD.  11/29/2022 10:51 AM   [name] 01/06/1966 [id number]  Referring provider: Barbette Reichmann, MD [address] [facility] [place],  [place] [address]  Chief Complaint  Patient presents with   Elevated PSA   Establish Care    HPI: 57 year-old male who presents today for further evaluation of elevated PSA. He had a PSA drawn in 10/29/2022, which was 4.18.   His PSA prior to that was 2.5 in 01/2022 and 1.49 in 01/2021. He did have a CT scan back in 2016 that did show some inflammation around the prostate and bladder consistent with possible cystitis, prostatitis. Urinalysis on 10/29/2022 at the time of his PSA, was unremarkable.   He does have baseline CKD. Creatinine on 10/29/2022 was 1.9.   He does have erectile dysfunction and takes tadalafil. He is also on doxazosin 4 mg daily.   Today, he reports nocturia x4, urgency, and frequency. He states that these symptoms have been present for a while and are not new. He states that the nighttime symptoms are more bothersome than during the day. He denies snoring or having sleep apnea.     He denies any family history of prostate cancer but reports that the mother passed away from ovarian cancer.    IPSS     Row Name 11/29/22 1000         International Prostate Symptom Score   How often have you had the sensation of not emptying your bladder? Less than 1 in 5     How often have you had to urinate less than every two hours? About half the time     How often have you found you stopped and started again several times when you urinated? Less than 1 in 5 times     How often have you found it difficult to postpone urination? About half the time     How often have you had a weak urinary stream?  Less than 1 in 5 times     How often have you had to strain to start urination? Not at All     How many times did you typically get up at night to urinate? 4 Times     Total IPSS Score 13       Quality of Life due to urinary symptoms   If you were to spend the rest of your life with your urinary condition just the way it is now how would you feel about that? 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[facility])     Surgical History: Past Surgical History:  Procedure Laterality Date   COLONOSCOPY WITH PROPOFOL N/A 07/14/2015   Procedure: COLONOSCOPY WITH PROPOFOL;  Surgeon: Scot Jun, MD;  Location: [facility] ENDOSCOPY;  Service: Endoscopy;  Laterality: N/A;   COLONOSCOPY WITH PROPOFOL N/A 11/02/2019   Procedure: COLONOSCOPY WITH PROPOFOL;  Surgeon: Regis Bill, MD;  Location: [facility] ENDOSCOPY;  Service: Endoscopy;  Laterality: N/A;   ESOPHAGOGASTRODUODENOSCOPY (EGD) WITH PROPOFOL N/A 04/28/2015   Procedure: ESOPHAGOGASTRODUODENOSCOPY (EGD) WITH PROPOFOL;  Surgeon: Elnita Maxwell, MD;  Location: [facility] ENDOSCOPY;  Service: Endoscopy;  Laterality: N/A;   ESOPHAGOGASTRODUODENOSCOPY (EGD) WITH PROPOFOL N/A 07/14/2015   Procedure: ESOPHAGOGASTRODUODENOSCOPY (EGD) WITH PROPOFOL;  Surgeon: Scot Jun, MD;  Location: [facility] ENDOSCOPY;  Service: Endoscopy;  Laterality: N/A;   ESOPHAGOGASTRODUODENOSCOPY (EGD)  WITH PROPOFOL N/A 01/29/2016   Procedure: ESOPHAGOGASTRODUODENOSCOPY (EGD) WITH PROPOFOL;  Surgeon: Scot Jun, MD;  Location: [facility] ENDOSCOPY;  Service: Endoscopy;  Laterality: N/A;   ESOPHAGOGASTRODUODENOSCOPY (EGD) WITH PROPOFOL N/A 12/29/2017   Procedure: ESOPHAGOGASTRODUODENOSCOPY (EGD) WITH PROPOFOL;  Surgeon: Scot Jun, MD;  Location: [facility] ENDOSCOPY;  Service: Endoscopy;  Laterality: N/A;   ESOPHAGOGASTRODUODENOSCOPY (EGD) WITH PROPOFOL N/A 03/03/2019   Procedure: ESOPHAGOGASTRODUODENOSCOPY (EGD) WITH PROPOFOL;  Surgeon: Toledo, Boykin Nearing, MD;  Location: [facility] ENDOSCOPY;  Service: Gastroenterology;  Laterality: N/A;   ESOPHAGOGASTRODUODENOSCOPY (EGD) WITH PROPOFOL N/A 11/02/2019   Procedure: ESOPHAGOGASTRODUODENOSCOPY (EGD) WITH PROPOFOL;  Surgeon: Regis Bill, MD;  Location: [facility] ENDOSCOPY;  Service: Endoscopy;  Laterality: N/A;   ESOPHAGOGASTRODUODENOSCOPY (EGD) WITH PROPOFOL N/A 12/18/2020   Procedure: ESOPHAGOGASTRODUODENOSCOPY (EGD) WITH PROPOFOL;  Surgeon: Regis Bill, MD;  Location: [facility] ENDOSCOPY;  Service: Endoscopy;  Laterality: N/A;  DM   LUNG BIOPSY     RENAL BIOPSY     SAVORY DILATION N/A 01/29/2016   Procedure: SAVORY DILATION;  Surgeon: Scot Jun, MD;  Location: [facility] ENDOSCOPY;  Service: Endoscopy;  Laterality: N/A;   UPPER GI ENDOSCOPY      Home Medications:  Allergies as of 11/29/2022       Reactions   Omeprazole    Protonix [pantoprazole Sodium]         Medication List        Accurate as of November 29, 2022 10:51 AM. If you have any questions, ask your nurse or doctor.          STOP taking these medications    TADALAFIL PO       TAKE these medications    Adcirca 20 MG tablet Generic drug: tadalafil (PAH) Take 1 tablet by mouth 2 (two) times daily.   aspirin EC 81 MG tablet Take 81 mg by mouth daily.   cloNIDine 0.2 mg/24hr patch Commonly known as: CATAPRES - Dosed in mg/24 hr Place 0.2 mg onto the skin  once a week.   doxazosin 4 MG tablet Commonly known as: CARDURA Take 4 mg by mouth daily.   famotidine 40 MG tablet Commonly known as: PEPCID Take 40 mg by mouth daily.   furosemide 40 MG tablet Commonly known as: LASIX Take 40 mg by mouth daily as needed.   glimepiride 4 MG tablet Commonly known as: AMARYL Take 4 mg by mouth daily with breakfast.   iron polysaccharides 150 MG capsule Commonly known as: NIFEREX Take 1 capsule by mouth 2 (two) times daily.   ketoconazole 2 % shampoo Commonly known as: NIZORAL Use as body wash three times weekly, let sit a few minutes before rinsing.   linagliptin  5 MG Tabs tablet Commonly known as: TRADJENTA Take 5 mg by mouth daily.   losartan 100 MG tablet Commonly known as: COZAAR Take 100 mg by mouth daily.   macitentan 10 MG tablet Commonly known as: OPSUMIT Take 10 mg by mouth daily.   meloxicam 7.5 MG tablet Commonly known as: MOBIC Take 7.5 mg by mouth daily.   metFORMIN 1000 MG tablet Commonly known as: GLUCOPHAGE Take 1,000 mg by mouth 2 (two) times daily with a meal.   mycophenolate 500 MG tablet Commonly known as: CELLCEPT Take 1,500 mg by mouth 2 (two) times daily.   NIFEdipine 90 MG 24 hr tablet Commonly known as: ADALAT CC Take 90 mg by mouth daily.   OXYGEN Inhale 2 L/min into the lungs continuous.   potassium chloride 10 MEQ CR capsule Commonly known as: MICRO-K Take by mouth.   valsartan 320 MG tablet Commonly known as: DIOVAN Take 320 mg by mouth daily.        Allergies:  Allergies  Allergen Reactions   Omeprazole    Protonix [Pantoprazole Sodium]     Family History: Family History  Problem Relation Age of Onset   Cancer Mother    Diabetes Mellitus II Mother    Diabetes Mother    Diabetes Mellitus II Father    Diabetes Father    Glaucoma Paternal Aunt    Diabetes Paternal Aunt    Diabetes Paternal Grandmother    Diabetes Paternal Grandfather     Social History:  reports that  he quit smoking about 4 years ago. His smoking use included cigarettes. He started smoking about 32 years ago. He has a 7 pack-year smoking history. He quit smokeless tobacco use about 37 years ago. He reports current alcohol use. He reports that he does not use drugs.   Physical Exam: BP (!) 170/91   Pulse 76   Ht 6' 1.5" (1.867 m)   Wt 246 lb 2 oz (111.6 kg)   BMI 32.03 kg/m   Constitutional:  Alert and oriented, No acute distress. HEENT: [place] AT, moist mucus membranes.  Trachea midline, no masses. GU: Enlarged prostate, no nodularity Neurologic: Grossly intact, no focal deficits, moving all 4 extremities. Psychiatric: Normal mood and affect.   Assessment & Plan:    1. Elevated PSA - Repeat PSA test to check for fluctuations.  -DDx discussed today including inflammation which he has had in the past -Overall PSA rise is concerning.  Also family history of ovarian cancer ? BRCA. - If PSA remains elevated, consider a prostate biopsy to rule out malignancy.  - Provided him with information about the biopsy procedure. - We discussed prostate biopsy in detail including the procedure itself, the risks of blood in the urine, stool, and ejaculate, serious infection, and discomfort.   2. BPH with nocturia - Address urinary symptoms after PSA results. If symptoms persist and PSA is stable, consider further evaluation and management for BPH or other underlying causes.  Return in about 1 week (around 12/06/2022) for PSA results and further management plan.  I have reviewed the above documentation for accuracy and completeness, and I agree with the above.   Vanna Scotland, MD  [facility] [address], [address] [place], [place] [address] [phone]

## 2022-11-29 NOTE — Patient Instructions (Signed)
Prostate Biopsy Instructions  Stop all aspirin or blood thinners (aspirin, plavix, coumadin, warfarin, motrin, ibuprofen, advil, aleve, naproxen, naprosyn) for 7 days prior to the procedure.  If you have any questions about stopping these medications, please contact your primary care physician or cardiologist.  Having a light meal prior to the procedure is recommended.  If you are diabetic or have low blood sugar please bring a small snack or glucose tablet.  A Fleets enema is needed to be purchased over the counter at a local pharmacy and used 2 hours before you scheduled appointment.  This can be purchased over the counter at any pharmacy.  Antibiotics will be administered in the clinic at the time of the procedure unless otherwise specified.    Please bring someone with you to the procedure to drive you home.  A follow up appointment has been scheduled for you to receive the results of the biopsy.  If you have any questions or concerns, please feel free to call the office at [phone] or send a Mychart message.    Thank you, Staff at [facility]

## 2022-12-03 ENCOUNTER — Ambulatory Visit: Payer: Medicare Other | Admitting: Urology

## 2022-12-03 VITALS — BP 162/77 | HR 98

## 2022-12-03 DIAGNOSIS — N401 Enlarged prostate with lower urinary tract symptoms: Secondary | ICD-10-CM | POA: Diagnosis not present

## 2022-12-03 DIAGNOSIS — R351 Nocturia: Secondary | ICD-10-CM

## 2022-12-03 DIAGNOSIS — R972 Elevated prostate specific antigen [PSA]: Secondary | ICD-10-CM | POA: Diagnosis not present

## 2022-12-03 LAB — BLADDER SCAN AMB NON-IMAGING: Scan Result: 18

## 2022-12-03 MED ORDER — GEMTESA 75 MG PO TABS: 1.0000 | ORAL_TABLET | Freq: Every day | ORAL | 11 refills | Status: DC

## 2022-12-03 MED ORDER — DOXAZOSIN MESYLATE 8 MG PO TABS: 8.0000 mg | ORAL_TABLET | Freq: Every day | ORAL | 11 refills | Status: DC

## 2022-12-03 NOTE — Progress Notes (Signed)
Joe Mathews,acting as a scribe for Joe Scotland, MD.,have documented all relevant documentation on the behalf of Joe Scotland, MD,as directed by  Joe Scotland, MD while in the presence of Joe Scotland, MD.  12/03/2022 11:01 AM   Joe Mathews [birth year] [id number]  Referring provider: Barbette Reichmann, MD [address] [facility] [place],  [place] [address]  Chief Complaint  Patient presents with   Follow-up    HPI: 57 year-old male who presents today for a follow up of urinary symptoms.   He was last seen by me on 11/29/2022 primarily for elevated/rising PSA up to 4.18 over the past several years. His rectal exam was benign and his PSA trended back down to 3.4. He did have some notable prostatomegaly.   He returns today specifically to discuss his urinary issues. He reports getting up 4 to 5 times at night with urgency and frequency. He feels that his bladder fills up quickly and needs to urinate again soon after. He is currently taking Tadalafil, possibly for pulmonary fibrosis, and Doxazosin, likely for blood pressure. He denies issues with dizziness or lightheadedness. He feels the urinary stream is good and believes the bladder empties well.   Results for orders placed or performed in visit on 12/03/22  BLADDER SCAN AMB NON-IMAGING  Result Value Ref Range   Scan Result 18 ml      IPSS     Row Name 12/03/22 1000         International Prostate Symptom Score   How often have you had the sensation of not emptying your bladder? Less than half the time     How often have you had to urinate less than every two hours? More than half the time     How often have you found you stopped and started again several times when you urinated? Less than half the time     How often have you found it difficult to postpone urination? More than half the time     How often have you had a weak urinary stream? About half the time     How often have you had to  strain to start urination? About half the time     How many times did you typically get up at night to urinate? 5 Times     Total IPSS Score 23       Quality of Life due to urinary symptoms   If you were to spend the rest of your life with your urinary condition just the way it is now how would you feel about that? Mostly Disatisfied              Score:  1-7 Mild 8-19 Moderate 20-35 Severe  PMH: Past Medical History:  Diagnosis Date   Anemia    Chronic kidney disease    stage 3   Collagen vascular disease (HCC)    Diabetes mellitus without complication (HCC)    Dysphagia    Dysrhythmia    Esophagitis    GERD (gastroesophageal reflux disease)    Hyperlipidemia    Hypertension    NSIP (nonspecific interstitial pneumonia) (HCC)    NSIP (nonspecific interstitial pneumonia) (HCC)    Pulmonary fibrosis (HCC)    Pulmonary hypertension (HCC)    Raynaud disease    Raynaud's disease    Schatzki's ring    Sclerodactyly    Scleroderma (HCC)     Surgical History: Past Surgical History:  Procedure Laterality Date  COLONOSCOPY WITH PROPOFOL N/A 07/14/2015   Procedure: COLONOSCOPY WITH PROPOFOL;  Surgeon: Scot Jun, MD;  Location: [facility] ENDOSCOPY;  Service: Endoscopy;  Laterality: N/A;   COLONOSCOPY WITH PROPOFOL N/A 11/02/2019   Procedure: COLONOSCOPY WITH PROPOFOL;  Surgeon: Regis Bill, MD;  Location: [facility] ENDOSCOPY;  Service: Endoscopy;  Laterality: N/A;   ESOPHAGOGASTRODUODENOSCOPY (EGD) WITH PROPOFOL N/A 04/28/2015   Procedure: ESOPHAGOGASTRODUODENOSCOPY (EGD) WITH PROPOFOL;  Surgeon: Elnita Maxwell, MD;  Location: [facility] ENDOSCOPY;  Service: Endoscopy;  Laterality: N/A;   ESOPHAGOGASTRODUODENOSCOPY (EGD) WITH PROPOFOL N/A 07/14/2015   Procedure: ESOPHAGOGASTRODUODENOSCOPY (EGD) WITH PROPOFOL;  Surgeon: Scot Jun, MD;  Location: [facility] ENDOSCOPY;  Service: Endoscopy;  Laterality: N/A;   ESOPHAGOGASTRODUODENOSCOPY (EGD) WITH PROPOFOL N/A 01/29/2016    Procedure: ESOPHAGOGASTRODUODENOSCOPY (EGD) WITH PROPOFOL;  Surgeon: Scot Jun, MD;  Location: [facility] ENDOSCOPY;  Service: Endoscopy;  Laterality: N/A;   ESOPHAGOGASTRODUODENOSCOPY (EGD) WITH PROPOFOL N/A 12/29/2017   Procedure: ESOPHAGOGASTRODUODENOSCOPY (EGD) WITH PROPOFOL;  Surgeon: Scot Jun, MD;  Location: [facility] ENDOSCOPY;  Service: Endoscopy;  Laterality: N/A;   ESOPHAGOGASTRODUODENOSCOPY (EGD) WITH PROPOFOL N/A 03/03/2019   Procedure: ESOPHAGOGASTRODUODENOSCOPY (EGD) WITH PROPOFOL;  Surgeon: Toledo, Boykin Nearing, MD;  Location: [facility] ENDOSCOPY;  Service: Gastroenterology;  Laterality: N/A;   ESOPHAGOGASTRODUODENOSCOPY (EGD) WITH PROPOFOL N/A 11/02/2019   Procedure: ESOPHAGOGASTRODUODENOSCOPY (EGD) WITH PROPOFOL;  Surgeon: Regis Bill, MD;  Location: [facility] ENDOSCOPY;  Service: Endoscopy;  Laterality: N/A;   ESOPHAGOGASTRODUODENOSCOPY (EGD) WITH PROPOFOL N/A 12/18/2020   Procedure: ESOPHAGOGASTRODUODENOSCOPY (EGD) WITH PROPOFOL;  Surgeon: Regis Bill, MD;  Location: [facility] ENDOSCOPY;  Service: Endoscopy;  Laterality: N/A;  DM   LUNG BIOPSY     RENAL BIOPSY     SAVORY DILATION N/A 01/29/2016   Procedure: SAVORY DILATION;  Surgeon: Scot Jun, MD;  Location: [facility] ENDOSCOPY;  Service: Endoscopy;  Laterality: N/A;   UPPER GI ENDOSCOPY      Home Medications:  Allergies as of 12/03/2022       Reactions   Omeprazole    Protonix [pantoprazole Sodium]         Medication List        Accurate as of December 03, 2022 11:01 AM. If you have any questions, ask your nurse or doctor.          Adcirca 20 MG tablet Generic drug: tadalafil (PAH) Take 1 tablet by mouth 2 (two) times daily.   aspirin EC 81 MG tablet Take 81 mg by mouth daily.   cloNIDine 0.2 mg/24hr patch Commonly known as: CATAPRES - Dosed in mg/24 hr Place 0.2 mg onto the skin once a week.   doxazosin 8 MG tablet Commonly known as: CARDURA Take 1 tablet (8 mg total) by mouth daily. What  changed:  medication strength how much to take   famotidine 40 MG tablet Commonly known as: PEPCID Take 40 mg by mouth daily.   furosemide 40 MG tablet Commonly known as: LASIX Take 40 mg by mouth daily as needed.   Gemtesa 75 MG Tabs Generic drug: Vibegron Take 1 tablet (75 mg total) by mouth daily.   glimepiride 4 MG tablet Commonly known as: AMARYL Take 4 mg by mouth daily with breakfast.   iron polysaccharides 150 MG capsule Commonly known as: NIFEREX Take 1 capsule by mouth 2 (two) times daily.   ketoconazole 2 % shampoo Commonly known as: NIZORAL Use as body wash three times weekly, let sit a few minutes before rinsing.   linagliptin 5 MG Tabs tablet Commonly known as: TRADJENTA  Take 5 mg by mouth daily.   losartan 100 MG tablet Commonly known as: COZAAR Take 100 mg by mouth daily.   macitentan 10 MG tablet Commonly known as: OPSUMIT Take 10 mg by mouth daily.   meloxicam 7.5 MG tablet Commonly known as: MOBIC Take 7.5 mg by mouth daily.   metFORMIN 1000 MG tablet Commonly known as: GLUCOPHAGE Take 1,000 mg by mouth 2 (two) times daily with a meal.   mycophenolate 500 MG tablet Commonly known as: CELLCEPT Take 1,500 mg by mouth 2 (two) times daily.   NIFEdipine 90 MG 24 hr tablet Commonly known as: ADALAT CC Take 90 mg by mouth daily.   omeprazole 20 MG capsule Commonly known as: PRILOSEC Take 1 tablet by mouth daily.   OXYGEN Inhale 2 L/min into the lungs continuous.   potassium chloride 10 MEQ CR capsule Commonly known as: MICRO-K Take by mouth.   valsartan 320 MG tablet Commonly known as: DIOVAN Take 320 mg by mouth daily.        Allergies:  Allergies  Allergen Reactions   Omeprazole    Protonix [Pantoprazole Sodium]     Family History: Family History  Problem Relation Age of Onset   Cancer Mother    Diabetes Mellitus II Mother    Diabetes Mother    Diabetes Mellitus II Father    Diabetes Father    Glaucoma Paternal  Aunt    Diabetes Paternal Aunt    Diabetes Paternal Grandmother    Diabetes Paternal Grandfather     Social History:  reports that he quit smoking about 4 years ago. His smoking use included cigarettes. He started smoking about 32 years ago. He has a 7 pack-year smoking history. He quit smokeless tobacco use about 37 years ago. He reports current alcohol use. He reports that he does not use drugs.   Physical Exam: BP (!) 162/77   Pulse 98   Constitutional:  Alert and oriented, No acute distress. HEENT: [place] AT, moist mucus membranes.  Trachea midline, no masses. Neurologic: Grossly intact, no focal deficits, moving all 4 extremities. Psychiatric: Normal mood and affect.   Assessment & Plan:    1. Elevated PSA - PSA has decreased from over 4 to 3.4  - Continue to monitor PSA levels. Recheck in six months. No immediate need for biopsy.  2. BPH with nocturia - Increase Doxazosin from 4 mg to 8 mg to improve urinary symptoms and aid in blood pressure control.  - Prescribe Gemtesa 75 mg for overactive bladder symptoms.  - Monitor for improvement over the next month. If no improvement, consider alternative treatments. - Recommend sleep study to evaluate for possible sleep apnea as likely cause of nocturia  Return in about 6 months (around 06/02/2023) for repeat PSA. Follow up If no improvement in urinary symptoms after 1 month .  I have reviewed the above documentation for accuracy and completeness, and I agree with the above.   Joe Scotland, MD   [facility] [address] [place], [place] [address] [phone]

## 2023-05-28 ENCOUNTER — Other Ambulatory Visit: Payer: Self-pay

## 2023-05-28 DIAGNOSIS — R972 Elevated prostate specific antigen [PSA]: Secondary | ICD-10-CM

## 2023-05-29 LAB — PSA: Prostate Specific Ag, Serum: 2.6 ng/mL (ref 0.0–4.0)

## 2023-06-03 ENCOUNTER — Ambulatory Visit (INDEPENDENT_AMBULATORY_CARE_PROVIDER_SITE_OTHER): Payer: Self-pay | Admitting: Physician Assistant

## 2023-06-03 VITALS — BP 164/82 | HR 76 | Ht 74.0 in | Wt 245.0 lb

## 2023-06-03 DIAGNOSIS — N401 Enlarged prostate with lower urinary tract symptoms: Secondary | ICD-10-CM

## 2023-06-03 DIAGNOSIS — R351 Nocturia: Secondary | ICD-10-CM | POA: Diagnosis not present

## 2023-06-03 DIAGNOSIS — R972 Elevated prostate specific antigen [PSA]: Secondary | ICD-10-CM | POA: Diagnosis not present

## 2023-06-03 LAB — BLADDER SCAN AMB NON-IMAGING

## 2023-06-03 MED ORDER — DOXAZOSIN MESYLATE 8 MG PO TABS: 8.0000 mg | ORAL_TABLET | Freq: Every day | ORAL | 3 refills | Status: AC

## 2023-06-03 MED ORDER — GEMTESA 75 MG PO TABS: 1.0000 | ORAL_TABLET | Freq: Every day | ORAL | 3 refills | Status: AC

## 2023-06-03 NOTE — Progress Notes (Signed)
 06/03/2023 10:48 AM   Joe Mathews 1966/01/02 [id number]  CC: Chief Complaint  Patient presents with   Elevated PSA   HPI: Joe Mathews is a 58 y.o. male with PMH BPH with prostatomegaly and nocturia on doxazosin  8 mg and Gemtesa  and elevated PSA who presents today for 94-month follow-up.   Today he reports his nocturia has improved somewhat, now every 2-3 hours, previously every hour.  Overall he is pleased with his voiding symptoms on his current regimen.  He thinks he may be taking his Gemtesa  in the morning.  PSA last week was down, 2.6, previously 3.4.  IPSS 14/mixed as below, previously 23/mostly dissatisfied.  PVR 70 mL.   IPSS     Row Name 06/03/23 1000         International Prostate Symptom Score   How often have you had the sensation of not emptying your bladder? Less than 1 in 5     How often have you had to urinate less than every two hours? Less than half the time     How often have you found you stopped and started again several times when you urinated? Less than 1 in 5 times     How often have you found it difficult to postpone urination? Less than half the time     How often have you had a weak urinary stream? About half the time     How often have you had to strain to start urination? Less than 1 in 5 times     How many times did you typically get up at night to urinate? 4 Times     Total IPSS Score 14       Quality of Life due to urinary symptoms   If you were to spend the rest of your life with your urinary condition just the way it is now how would you feel about that? Mixed              PMH: Past Medical History:  Diagnosis Date   Anemia    Chronic kidney disease    stage 3   Collagen vascular disease (HCC)    Diabetes mellitus without complication (HCC)    Dysphagia    Dysrhythmia    Esophagitis    GERD (gastroesophageal reflux disease)    Hyperlipidemia    Hypertension    NSIP (nonspecific interstitial pneumonia) (HCC)     NSIP (nonspecific interstitial pneumonia) (HCC)    Pulmonary fibrosis (HCC)    Pulmonary hypertension (HCC)    Raynaud disease    Raynaud's disease    Schatzki's ring    Sclerodactyly    Scleroderma (HCC)     Surgical History: Past Surgical History:  Procedure Laterality Date   COLONOSCOPY WITH PROPOFOL  N/A 07/14/2015   Procedure: COLONOSCOPY WITH PROPOFOL ;  Surgeon: Cassie Click, MD;  Location: [facility] ENDOSCOPY;  Service: Endoscopy;  Laterality: N/A;   COLONOSCOPY WITH PROPOFOL  N/A 11/02/2019   Procedure: COLONOSCOPY WITH PROPOFOL ;  Surgeon: Shane Darling, MD;  Location: [facility] ENDOSCOPY;  Service: Endoscopy;  Laterality: N/A;   ESOPHAGOGASTRODUODENOSCOPY (EGD) WITH PROPOFOL  N/A 04/28/2015   Procedure: ESOPHAGOGASTRODUODENOSCOPY (EGD) WITH PROPOFOL ;  Surgeon: Luella Sager, MD;  Location: [facility] ENDOSCOPY;  Service: Endoscopy;  Laterality: N/A;   ESOPHAGOGASTRODUODENOSCOPY (EGD) WITH PROPOFOL  N/A 07/14/2015   Procedure: ESOPHAGOGASTRODUODENOSCOPY (EGD) WITH PROPOFOL ;  Surgeon: Cassie Click, MD;  Location: [facility] ENDOSCOPY;  Service: Endoscopy;  Laterality: N/A;   ESOPHAGOGASTRODUODENOSCOPY (EGD) WITH PROPOFOL  N/A 01/29/2016  Procedure: ESOPHAGOGASTRODUODENOSCOPY (EGD) WITH PROPOFOL ;  Surgeon: Cassie Click, MD;  Location: [facility] ENDOSCOPY;  Service: Endoscopy;  Laterality: N/A;   ESOPHAGOGASTRODUODENOSCOPY (EGD) WITH PROPOFOL  N/A 12/29/2017   Procedure: ESOPHAGOGASTRODUODENOSCOPY (EGD) WITH PROPOFOL ;  Surgeon: Cassie Click, MD;  Location: [facility] ENDOSCOPY;  Service: Endoscopy;  Laterality: N/A;   ESOPHAGOGASTRODUODENOSCOPY (EGD) WITH PROPOFOL  N/A 03/03/2019   Procedure: ESOPHAGOGASTRODUODENOSCOPY (EGD) WITH PROPOFOL ;  Surgeon: Toledo, Alphonsus Jeans, MD;  Location: [facility] ENDOSCOPY;  Service: Gastroenterology;  Laterality: N/A;   ESOPHAGOGASTRODUODENOSCOPY (EGD) WITH PROPOFOL  N/A 11/02/2019   Procedure: ESOPHAGOGASTRODUODENOSCOPY (EGD) WITH PROPOFOL ;  Surgeon: Shane Darling,  MD;  Location: [facility] ENDOSCOPY;  Service: Endoscopy;  Laterality: N/A;   ESOPHAGOGASTRODUODENOSCOPY (EGD) WITH PROPOFOL  N/A 12/18/2020   Procedure: ESOPHAGOGASTRODUODENOSCOPY (EGD) WITH PROPOFOL ;  Surgeon: Shane Darling, MD;  Location: [facility] ENDOSCOPY;  Service: Endoscopy;  Laterality: N/A;  DM   LUNG BIOPSY     RENAL BIOPSY     SAVORY DILATION N/A 01/29/2016   Procedure: SAVORY DILATION;  Surgeon: Cassie Click, MD;  Location: [facility] ENDOSCOPY;  Service: Endoscopy;  Laterality: N/A;   UPPER GI ENDOSCOPY      Home Medications:  Allergies as of 06/03/2023       Reactions   Omeprazole    Protonix [pantoprazole Sodium]         Medication List        Accurate as of Jun 03, 2023 10:48 AM. If you have any questions, ask your nurse or doctor.          Adcirca 20 MG tablet Generic drug: tadalafil (PAH) Take 1 tablet by mouth 2 (two) times daily.   aspirin EC 81 MG tablet Take 81 mg by mouth daily.   cloNIDine 0.2 mg/24hr patch Commonly known as: CATAPRES - Dosed in mg/24 hr Place 0.2 mg onto the skin once a week.   doxazosin  8 MG tablet Commonly known as: CARDURA  Take 1 tablet (8 mg total) by mouth daily.   famotidine 40 MG tablet Commonly known as: PEPCID Take 40 mg by mouth daily.   furosemide 40 MG tablet Commonly known as: LASIX Take 40 mg by mouth daily as needed.   Gemtesa  75 MG Tabs Generic drug: Vibegron  Take 1 tablet (75 mg total) by mouth daily.   glimepiride 4 MG tablet Commonly known as: AMARYL Take 4 mg by mouth daily with breakfast.   iron polysaccharides 150 MG capsule Commonly known as: NIFEREX Take 1 capsule by mouth 2 (two) times daily.   ketoconazole  2 % shampoo Commonly known as: NIZORAL  Use as body wash three times weekly, let sit a few minutes before rinsing.   linagliptin 5 MG Tabs tablet Commonly known as: TRADJENTA Take 5 mg by mouth daily.   losartan 100 MG tablet Commonly known as: COZAAR Take 100 mg by mouth daily.    macitentan 10 MG tablet Commonly known as: OPSUMIT Take 10 mg by mouth daily.   meloxicam 7.5 MG tablet Commonly known as: MOBIC Take 7.5 mg by mouth daily.   metFORMIN 1000 MG tablet Commonly known as: GLUCOPHAGE Take 1,000 mg by mouth 2 (two) times daily with a meal.   mycophenolate 500 MG tablet Commonly known as: CELLCEPT Take 1,500 mg by mouth 2 (two) times daily.   NIFEdipine 90 MG 24 hr tablet Commonly known as: ADALAT CC Take 90 mg by mouth daily.   omeprazole 20 MG capsule Commonly known as: PRILOSEC Take 1 tablet by mouth daily.   OXYGEN Inhale 2 L/min into  the lungs continuous.   potassium chloride 10 MEQ CR capsule Commonly known as: MICRO-K Take by mouth.   valsartan 320 MG tablet Commonly known as: DIOVAN Take 320 mg by mouth daily.        Allergies:  Allergies  Allergen Reactions   Omeprazole    Protonix [Pantoprazole Sodium]     Family History: Family History  Problem Relation Age of Onset   Cancer Mother    Diabetes Mellitus II Mother    Diabetes Mother    Diabetes Mellitus II Father    Diabetes Father    Glaucoma Paternal Aunt    Diabetes Paternal Aunt    Diabetes Paternal Grandmother    Diabetes Paternal Grandfather     Social History:   reports that he quit smoking about 4 years ago. His smoking use included cigarettes. He started smoking about 32 years ago. He has a 7 pack-year smoking history. He quit smokeless tobacco use about 37 years ago. He reports current alcohol use. He reports that he does not use drugs.  Physical Exam: BP (!) 164/82   Pulse 76   Ht 6\' 2"  (1.88 m)   Wt 245 lb (111.1 kg)   BMI 31.46 kg/m   Constitutional:  Alert and oriented, no acute distress, nontoxic appearing HEENT: [place], AT Cardiovascular: No clubbing, cyanosis, or edema Respiratory: Normal respiratory effort, no increased work of breathing Skin: No rashes, bruises or suspicious lesions Neurologic: Grossly intact, no focal deficits, moving  all 4 extremities Psychiatric: Normal mood and affect  Laboratory Data: Results for orders placed or performed in visit on 06/03/23  BLADDER SCAN AMB NON-IMAGING   Collection Time: 06/03/23 10:33 AM  Result Value Ref Range   Scan Result 76ml    Assessment & Plan:   1. Benign prostatic hyperplasia with nocturia (Primary) He is emptying appropriately and nocturia has improved on doxazosin  8 mg and Gemtesa .  Will plan to continue these.  May consider adding on finasteride or considering outlet procedure in the future if his symptoms worsen.  We did discuss trying Gemtesa  at bedtime to see if this would help a little bit more with his nocturia. - doxazosin  (CARDURA ) 8 MG tablet; Take 1 tablet (8 mg total) by mouth daily.  Dispense: 90 tablet; Refill: 3 - Vibegron  (GEMTESA ) 75 MG TABS; Take 1 tablet (75 mg total) by mouth daily.  Dispense: 90 tablet; Refill: 3  2. Elevated PSA PSA is downtrending.  In the setting of his known prostatomegaly, reassuring PSA density.  Very low suspicion for underlying malignancy, will continue to monitor. - BLADDER SCAN AMB NON-IMAGING   Return in about 1 year (around 06/02/2024) for Annual DRE/IPSS/PVR/PSA.  Kathreen Pare, PA-C  [facility] [place] [address], [address] [place], [place] [address] [phone]

## 2024-06-02 ENCOUNTER — Ambulatory Visit: Admitting: Physician Assistant
